# Patient Record
Sex: Female | Born: 1985 | Race: Black or African American | Hispanic: No | Marital: Single | Smoking: Former smoker
Health system: Southern US, Community
[De-identification: ages and names within clinical notes are randomized; demographics above are authoritative.]

## PROBLEM LIST (undated history)

## (undated) DIAGNOSIS — R87613 High grade squamous intraepithelial lesion on cytologic smear of cervix (HGSIL): Secondary | ICD-10-CM

## (undated) DIAGNOSIS — D649 Anemia, unspecified: Secondary | ICD-10-CM

## (undated) HISTORY — DX: Anemia, unspecified: D64.9

## (undated) HISTORY — DX: High grade squamous intraepithelial lesion on cytologic smear of cervix (HGSIL): R87.613

---

## 2004-12-27 ENCOUNTER — Emergency Department: Payer: Self-pay | Admitting: Emergency Medicine

## 2004-12-28 ENCOUNTER — Ambulatory Visit: Payer: Self-pay | Admitting: Emergency Medicine

## 2005-01-04 ENCOUNTER — Ambulatory Visit: Payer: Self-pay | Admitting: Obstetrics and Gynecology

## 2005-10-31 ENCOUNTER — Observation Stay: Payer: Self-pay | Admitting: Unknown Physician Specialty

## 2005-11-15 ENCOUNTER — Observation Stay: Payer: Self-pay

## 2005-11-16 ENCOUNTER — Inpatient Hospital Stay: Payer: Self-pay

## 2006-03-19 IMAGING — US US OB < 14 WEEKS - US OB TV
1 series · 14 of 28 positions shown · non-contrast
Comparison: none

REASON FOR EXAM: Pregnant with vaginal bleeding
COMMENTS:

[Series 1: us ob < 14 weeks - us ob tv · 0.38mm/px · 14 of 52 slices shown]
[im 2/52]
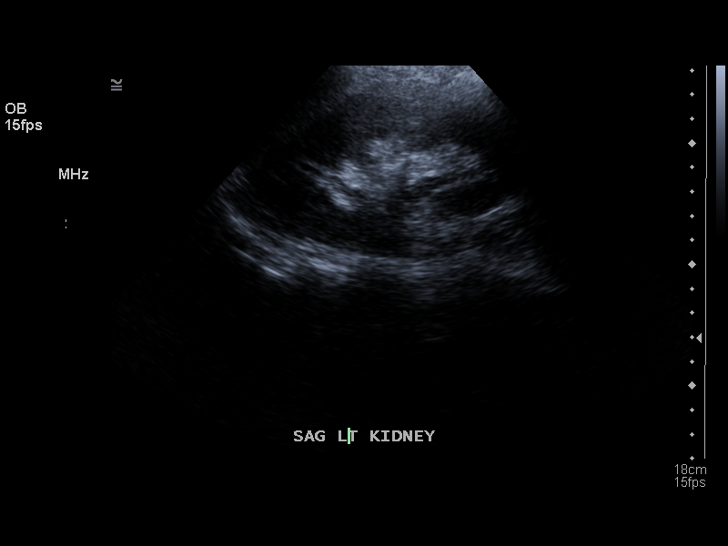
[im 6/52]
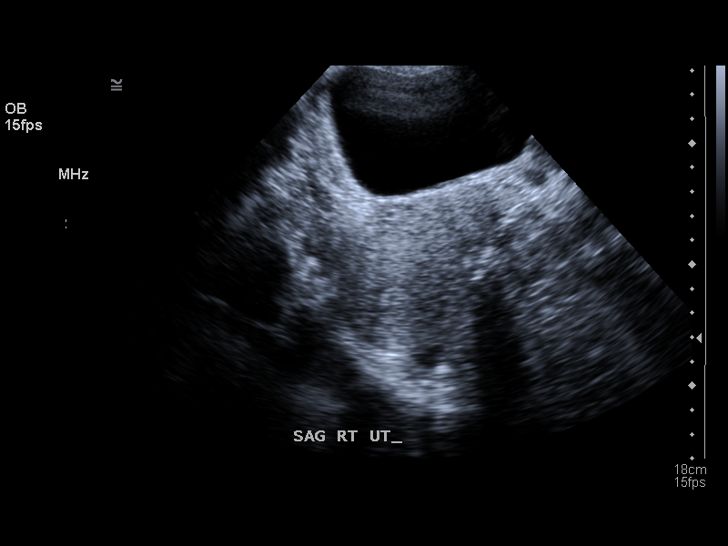
[im 10/52]
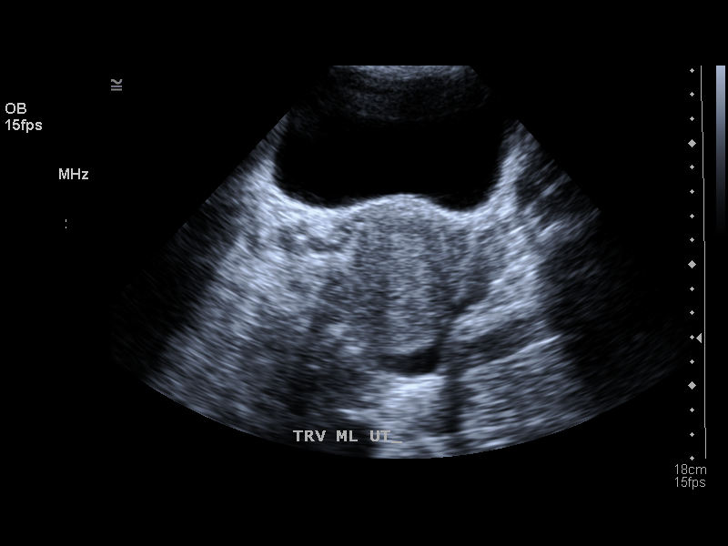
[im 14/52]
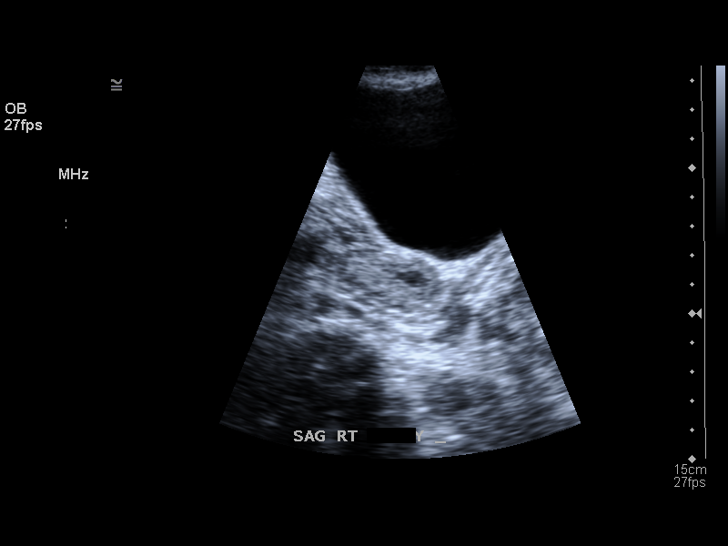
[im 18/52]
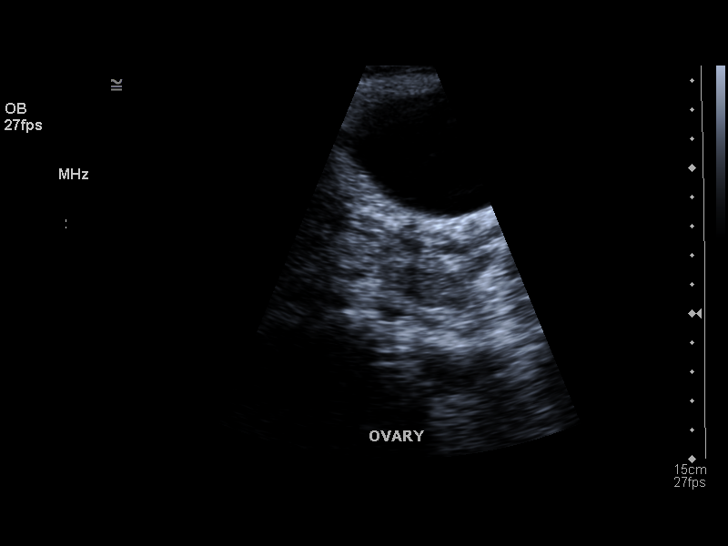
[im 21/52]
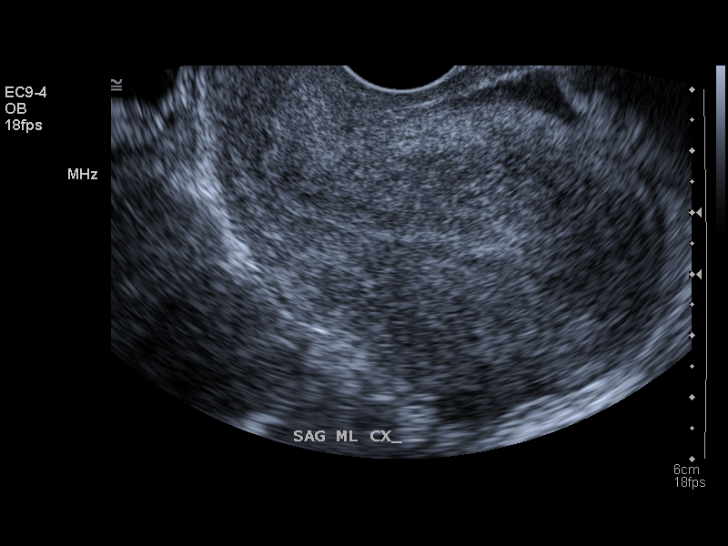
[im 25/52]
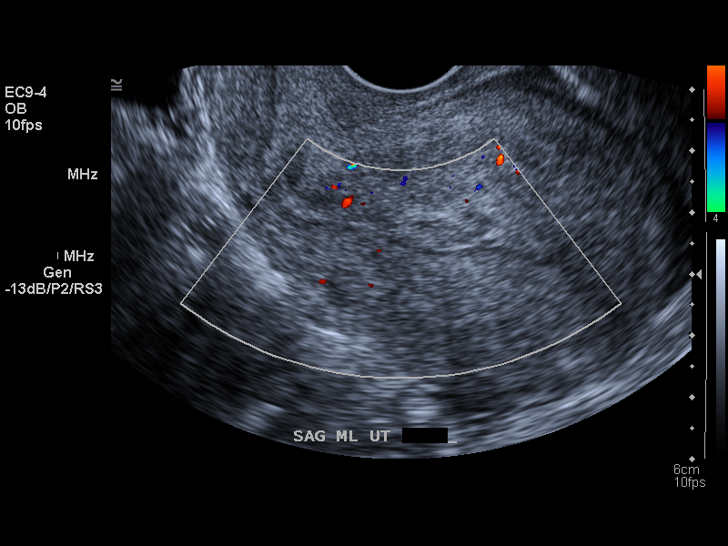
[im 29/52]
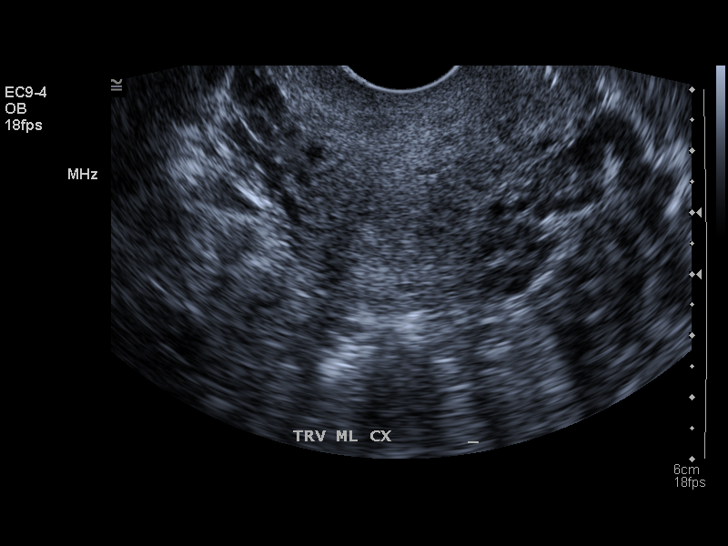
[im 33/52]
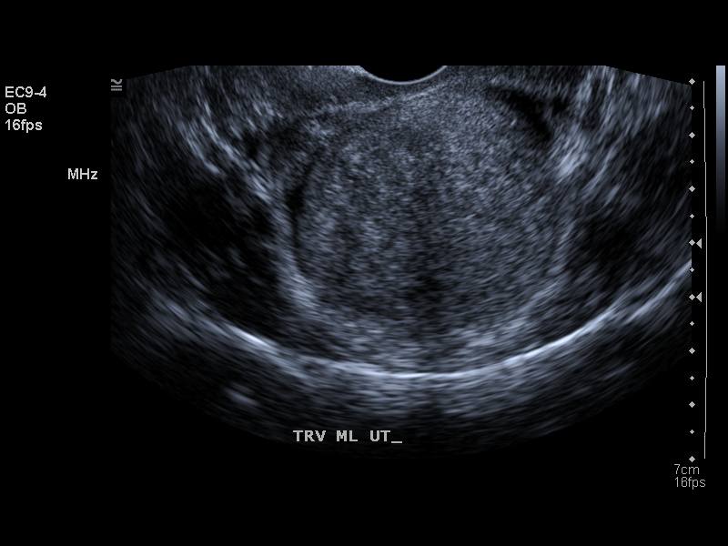
[im 36/52]
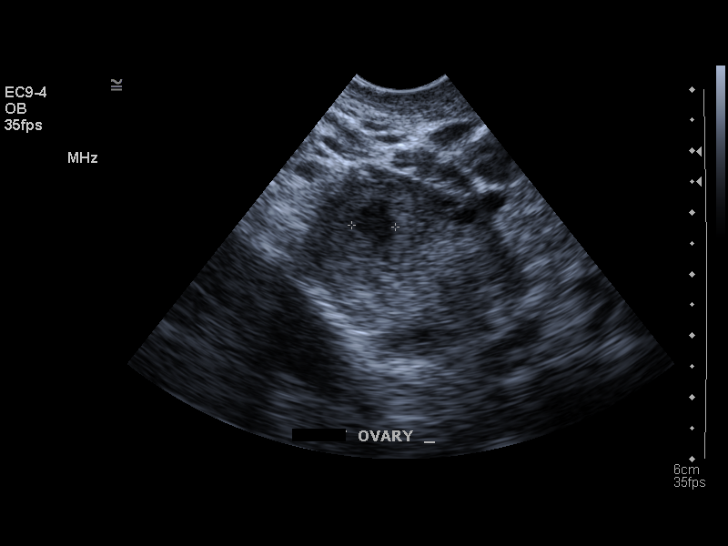
[im 40/52]
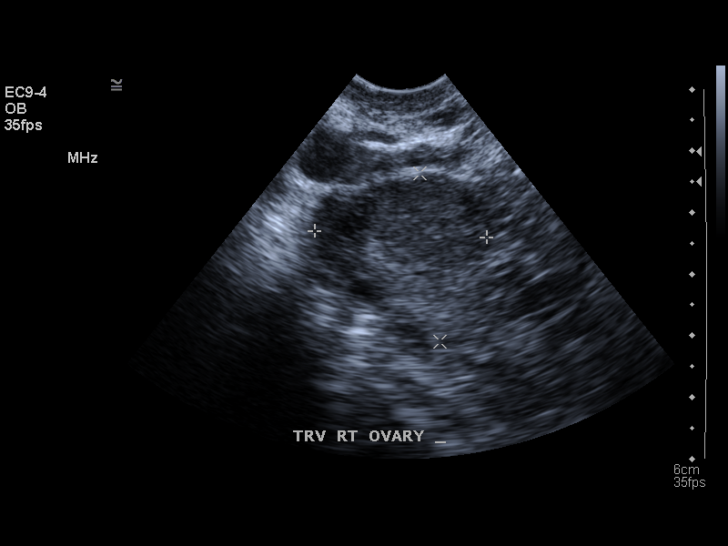
[im 44/52]
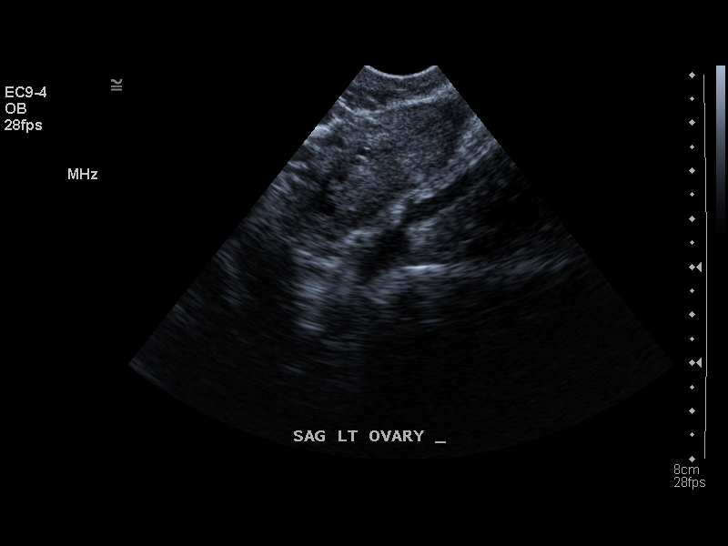
[im 48/52]
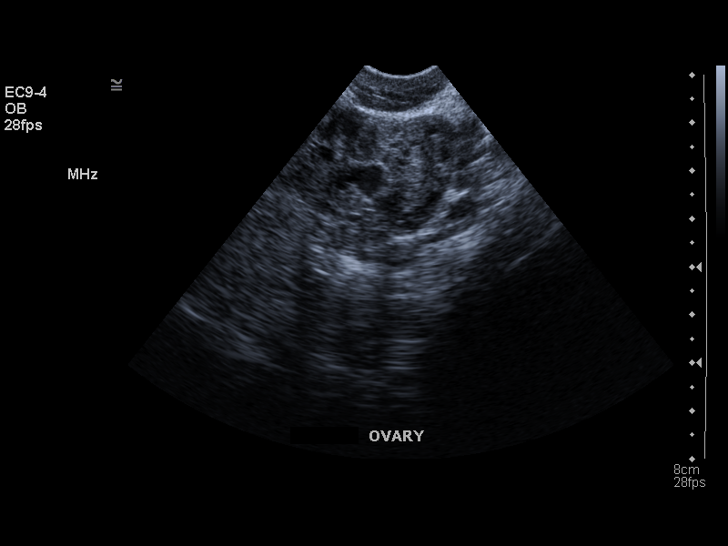
[im 52/52]
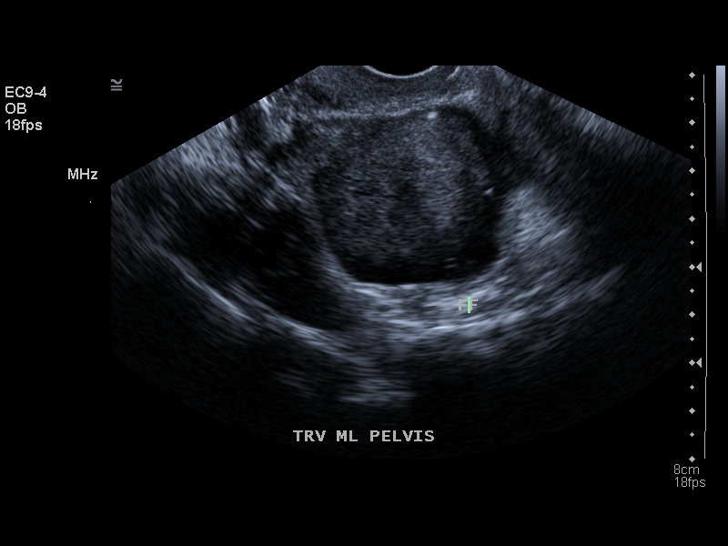

[14 of 28 positions shown; findings below may reference images not displayed]

PROCEDURE:     US  - US OB LESS THAN 14 WEEKS  - December 28, 2004 [DATE]

RESULT:       The patient has a positive pregnancy test.  The uterus
measures 10.4 cm x 7.3 cm x 6.4 cm.   The endometrium measures 1.66 cm in
thickness.  No intrauterine embryo is seen.  There does appear to be
echogenic material present in the endometrial cavity which could represent
clot or residual products of conception.  The findings observed are most
compatible with incomplete abortion.  Ectopic pregnancy is still a
consideration in the differential but is considered unlikely.   There is
noted a septated cyst of the RIGHT ovary which measures approximately
cm at maximum diameter.  No abnormal adnexal masses are seen.   No free
fluid is observed in the pelvis.
IMPRESSION: 1.     No living intrauterine gestation is identified.
2.     There is echogenic material in the endometrial cavity suspicious for
retained products of conception.
3.     No abnormal adnexal masses are seen.
4.     There is a septated cyst in the RIGHT ovary.
5.     Continued follow up or follow up HCG determinations are recommended.

## 2006-07-15 ENCOUNTER — Emergency Department: Payer: Self-pay | Admitting: General Practice

## 2006-08-16 ENCOUNTER — Emergency Department: Payer: Self-pay | Admitting: Emergency Medicine

## 2006-08-20 ENCOUNTER — Emergency Department: Payer: Self-pay

## 2012-01-23 HISTORY — PX: INTRAUTERINE DEVICE (IUD) INSERTION: SHX5877

## 2015-06-02 ENCOUNTER — Emergency Department
Admission: EM | Admit: 2015-06-02 | Discharge: 2015-06-02 | Disposition: A | Discharge status: Home / Self Care | Payer: Medicaid Other | Attending: Emergency Medicine | Admitting: Emergency Medicine

## 2015-06-02 ENCOUNTER — Encounter: Payer: Self-pay | Admitting: Emergency Medicine

## 2015-06-02 DIAGNOSIS — H109 Unspecified conjunctivitis: Secondary | ICD-10-CM

## 2015-06-02 DIAGNOSIS — H1031 Unspecified acute conjunctivitis, right eye: Secondary | ICD-10-CM | POA: Insufficient documentation

## 2015-06-02 DIAGNOSIS — H1030 Unspecified acute conjunctivitis, unspecified eye: Secondary | ICD-10-CM

## 2015-06-02 DIAGNOSIS — F17210 Nicotine dependence, cigarettes, uncomplicated: Secondary | ICD-10-CM | POA: Insufficient documentation

## 2015-06-02 MED ORDER — KETOROLAC TROMETHAMINE 0.5 % OP SOLN: 1.0000 [drp] | Freq: Four times a day (QID) | OPHTHALMIC | Status: DC

## 2015-06-02 MED ORDER — TETRACAINE HCL 0.5 % OP SOLN
OPHTHALMIC | Status: AC
  Filled 2015-06-02: qty 2

## 2015-06-02 MED ORDER — FLUORESCEIN SODIUM 1 MG OP STRP
ORAL_STRIP | OPHTHALMIC | Status: AC
  Filled 2015-06-02: qty 1

## 2015-06-02 MED ORDER — EYE WASH OPHTH SOLN
OPHTHALMIC | Status: AC
  Filled 2015-06-02: qty 118

## 2015-06-02 MED ORDER — POLYTRIM 10000-0.1 UNIT/ML-% OP SOLN: 2.0000 [drp] | OPHTHALMIC | Status: DC

## 2015-06-02 NOTE — Discharge Instructions (Signed)
You appear to have viral conjunctivitis. You should use the Acular drops for redness and irritation. Use OTC artificial tears as needed. Follow-up with your provider as needed.

## 2015-06-02 NOTE — ED Provider Notes (Signed)
[facility] Emergency Department Provider Note ____________________________________________  Time seen: 1041  I have reviewed the triage vital signs and the nursing notes.  HISTORY  Chief Complaint  Conjunctivitis  HPI Tammy Russo is a 30 y.o. female presents to the ED with irritation to the right eye for the last day. The patient describes some discomfort to the right eye that she noted yesterday. Since that time she's had increasing irritation, light sensitivity, and swelling to the eye. She is also noted copious tearing from the left eye. She denies any outright foreign body sensation or vision change.She rates her discomfort at a 5/10 in triage.  History reviewed. No pertinent past medical history.  There are no active problems to display for this patient.   History reviewed. No pertinent past surgical history.  Current Outpatient Rx  Name  Route  Sig  Dispense  Refill  . ketorolac (ACULAR) 0.5 % ophthalmic solution   Right Eye   Place 1 drop into the right eye 4 (four) times daily.   5 mL   0   . POLYTRIM ophthalmic solution   Right Eye   Place 2 drops into the right eye every 4 (four) hours.   10 mL   0     Dispense as written.     Allergies Review of patient's allergies indicates no known allergies.  No family history on file.  Social History Social History  Substance Use Topics  . Smoking status: Current Every Day Smoker -- 0.50 packs/day    Types: Cigarettes  . Smokeless tobacco: None  . Alcohol Use: Yes   Review of Systems  Constitutional: Negative for fever. Eyes: Negative for visual changes. Right eye irritation as above ENT: Negative for sore throat. Skin: Negative for rash. Neurological: Negative for headaches, focal weakness or numbness. ____________________________________________  PHYSICAL EXAM:  VITAL SIGNS: ED Triage Vitals  Enc Vitals Group     BP 06/02/15 0948 125/58 mmHg     Pulse Rate 06/02/15 0948  88     Resp 06/02/15 0948 18     Temp 06/02/15 0948 98.7 F (37.1 C)     Temp Source 06/02/15 0948 Oral     SpO2 06/02/15 0948 98 %     Weight 06/02/15 0948 180 lb (81.647 kg)     Height 06/02/15 0948 5\' 5"  (1.651 m)     Head Cir --      Peak Flow --      Pain Score 06/02/15 0948 5     Pain Loc --      Pain Edu? --      Excl. in [place]? --    Constitutional: Alert and oriented. Well appearing and in no distress. Head: Normocephalic and atraumatic.      Eyes: Conjunctivae are normal. PERRL. Normal extraocular movements. Right eye mildly injected with copious tearing. No gross foreign body noted. Normal upper lid on eversion. No fluorescein dye uptake.       Ears: Canals clear. TMs intact bilaterally.   Nose: No congestion/rhinorrhea.   Mouth/Throat: Mucous membranes are moist.   Neck: Supple. No thyromegaly. Hematological/Lymphatic/Immunological: No cervical or preauricilar lymphadenopathy. Respiratory: Normal respiratory effort.  Musculoskeletal: Nontender with normal range of motion in all extremities.  Neurologic:  Normal gait without ataxia. Normal speech and language. No gross focal neurologic deficits are appreciated. Skin:  Skin is warm, dry and intact. No rash noted. ____________________________________________  PROCEDURES  Tetracaine iii gtts right eye ____________________________________________  INITIAL IMPRESSION / ASSESSMENT  AND PLAN / ED COURSE  Patient with an acute gentamicin right eye likely consistent with a viral conjunctivitis. She'll be discharged with sugars for Acular as well as Polytrim for any potentially infectious etiology. She is advised to follow-up with her primary care provider or local [facility] clinic as needed. ____________________________________________  FINAL CLINICAL IMPRESSION(S) / ED DIAGNOSES  Final diagnoses:  Conjunctivitis, acute  Conjunctivitis of right eye      Melvenia Needles, PA-C 06/02/15 [place],  MD 06/04/15 [phone]

## 2015-06-02 NOTE — ED Notes (Signed)
Pt presents with right eye irritation and drainage that began yesterday. Pt states she thought she had a stye so she put stye cream in her eye.

## 2015-06-02 NOTE — ED Notes (Signed)
Stats she felt some pain to right eye yesterday  Unable to open eye this am    Swelling and drainage noted

## 2018-12-15 ENCOUNTER — Other Ambulatory Visit: Payer: Self-pay

## 2018-12-15 DIAGNOSIS — Z20822 Contact with and (suspected) exposure to covid-19: Secondary | ICD-10-CM

## 2018-12-16 LAB — NOVEL CORONAVIRUS, NAA: SARS-CoV-2, NAA: NOT DETECTED

## 2019-03-11 ENCOUNTER — Telehealth: Payer: Self-pay

## 2019-03-11 NOTE — Telephone Encounter (Signed)
Confirmed appointment on 03/16/2019 and screened for covid. klh  

## 2019-03-16 ENCOUNTER — Encounter: Payer: Self-pay | Admitting: Adult Health

## 2019-03-16 ENCOUNTER — Ambulatory Visit: Payer: Medicaid Other | Admitting: Adult Health

## 2019-03-16 ENCOUNTER — Other Ambulatory Visit: Payer: Self-pay

## 2019-03-16 VITALS — BP 122/76 | HR 80 | Temp 97.5°F | Resp 16 | Ht 64.5 in | Wt 188.2 lb

## 2019-03-16 DIAGNOSIS — Z7689 Persons encountering health services in other specified circumstances: Secondary | ICD-10-CM | POA: Diagnosis not present

## 2019-03-16 DIAGNOSIS — Z6831 Body mass index (BMI) 31.0-31.9, adult: Secondary | ICD-10-CM | POA: Diagnosis not present

## 2019-03-16 DIAGNOSIS — F17200 Nicotine dependence, unspecified, uncomplicated: Secondary | ICD-10-CM

## 2019-03-16 NOTE — Progress Notes (Signed)
[facility] [place], [place] [address]  Internal MEDICINE  Office Visit Note  Patient Name: Tammy Russo  [id number]  [id number]  Date of Service: 03/16/2019   Complaints/HPI Pt is here for establishment of PCP. Chief Complaint  Patient presents with  . New Patient (Initial Visit)   HPI Pt is seen today to establish care.  She is a well appearing 34 yo AA female.  She is currently single, living with her 3 kids who are 20,5, and 56 years old.  She reports she smokes half a pack of cigarettes daily.  She drinks alcohol socially, and reports she smokes marijuana occasionally.  She is up to date on her Pap.  Denies any real complaints at this time.  She is here to establish care to have a provider for the future.       Current Medication: Outpatient Encounter Medications as of 03/16/2019  Medication Sig  . [DISCONTINUED] ketorolac (ACULAR) 0.5 % ophthalmic solution Place 1 drop into the right eye 4 (four) times daily.  . [DISCONTINUED] POLYTRIM ophthalmic solution Place 2 drops into the right eye every 4 (four) hours.   No facility-administered encounter medications on file as of 03/16/2019.    Surgical History: No past surgical history on file.  Medical History: No past medical history on file.  Family History: No family history on file.  Social History   Socioeconomic History  . Marital status: Single    Spouse name: Not on file  . Number of children: Not on file  . Years of education: Not on file  . Highest education level: Not on file  Occupational History  . Not on file  Tobacco Use  . Smoking status: Current Every Day Smoker    Packs/day: 0.50    Types: Cigarettes  . Smokeless tobacco: Never Used  Substance and Sexual Activity  . Alcohol use: Yes    Comment: rare  . Drug use: Yes    Types: Marijuana  . Sexual activity: Not on file  Other Topics Concern  . Not on file  Social History Narrative  . Not on file   Social  Determinants of Health   Financial Resource Strain:   . Difficulty of Paying Living Expenses: Not on file  Food Insecurity:   . Worried About Charity fundraiser in the Last Year: Not on file  . Ran Out of Food in the Last Year: Not on file  Transportation Needs:   . Lack of Transportation (Medical): Not on file  . Lack of Transportation (Non-Medical): Not on file  Physical Activity:   . Days of Exercise per Week: Not on file  . Minutes of Exercise per Session: Not on file  Stress:   . Feeling of Stress : Not on file  Social Connections:   . Frequency of Communication with Friends and Family: Not on file  . Frequency of Social Gatherings with Friends and Family: Not on file  . Attends Religious Services: Not on file  . Active Member of Clubs or Organizations: Not on file  . Attends Archivist Meetings: Not on file  . Marital Status: Not on file  Intimate Partner Violence:   . Fear of Current or Ex-Partner: Not on file  . Emotionally Abused: Not on file  . Physically Abused: Not on file  . Sexually Abused: Not on file     Review of Systems  Constitutional: Negative for chills, fatigue and unexpected weight change.  HENT: Negative  for congestion, rhinorrhea, sneezing and sore throat.   Eyes: Negative for photophobia, pain and redness.  Respiratory: Negative for cough, chest tightness and shortness of breath.   Cardiovascular: Negative for chest pain and palpitations.  Gastrointestinal: Negative for abdominal pain, constipation, diarrhea, nausea and vomiting.  Endocrine: Negative.   Genitourinary: Negative for dysuria and frequency.  Musculoskeletal: Negative for arthralgias, back pain, joint swelling and neck pain.  Skin: Negative for rash.  Allergic/Immunologic: Negative.   Neurological: Negative for tremors and numbness.  Hematological: Negative for adenopathy. Does not bruise/bleed easily.  Psychiatric/Behavioral: Negative for behavioral problems and sleep  disturbance. The patient is not nervous/anxious.     Vital Signs: BP 122/76   Pulse 80   Temp (!) 97.5 F (36.4 C)   Resp 16   Ht 5' 4.5" (1.638 m)   Wt 188 lb 3.2 oz (85.4 kg)   SpO2 97%   BMI 31.81 kg/m    Physical Exam Vitals and nursing note reviewed.  Constitutional:      General: She is not in acute distress.    Appearance: She is well-developed. She is not diaphoretic.  HENT:     Head: Normocephalic and atraumatic.     Mouth/Throat:     Pharynx: No oropharyngeal exudate.  Eyes:     Pupils: Pupils are equal, round, and reactive to light.  Neck:     Thyroid: No thyromegaly.     Vascular: No JVD.     Trachea: No tracheal deviation.  Cardiovascular:     Rate and Rhythm: Normal rate and regular rhythm.     Heart sounds: Normal heart sounds. No murmur. No friction rub. No gallop.   Pulmonary:     Effort: Pulmonary effort is normal. No respiratory distress.     Breath sounds: Normal breath sounds. No wheezing or rales.  Chest:     Chest wall: No tenderness.  Abdominal:     Palpations: Abdomen is soft.     Tenderness: There is no abdominal tenderness. There is no guarding.  Musculoskeletal:        General: Normal range of motion.     Cervical back: Normal range of motion and neck supple.  Lymphadenopathy:     Cervical: No cervical adenopathy.  Skin:    General: Skin is warm and dry.  Neurological:     Mental Status: She is alert and oriented to person, place, and time.     Cranial Nerves: No cranial nerve deficit.  Psychiatric:        Behavior: Behavior normal.        Thought Content: Thought content normal.        Judgment: Judgment normal.    Assessment/Plan: 1. Encounter to establish care with new doctor Will get labs drawn. - CBC with Differential/Platelet - Lipid Panel With LDL/HDL Ratio - TSH - T4, free - Comprehensive metabolic panel  2. Adult BMI 31.0-31.9 kg/sq m Obesity Counseling: Risk Assessment: An assessment of behavioral risk factors  was made today and includes lack of exercise sedentary lifestyle, lack of portion control and poor dietary habits.  Risk Modification Advice: She was counseled on portion control guidelines. Restricting daily caloric intake to 1700. The detrimental long term effects of obesity on her health and ongoing poor compliance was also discussed with the patient.  3. Tobacco dependence Smoking cessation counseling: 1. Pt acknowledges the risks of long term smoking, she will try to quite smoking. 2. Options for different medications including nicotine products, chewing gum, patch etc,  Wellbutrin and Chantix is discussed 3. Goal and date of compete cessation is discussed 4. Total time spent in smoking cessation is 15 min.   General Counseling: Tammy Russo verbalizes understanding of the findings of todays visit and agrees with plan of treatment. I have discussed any further diagnostic evaluation that may be needed or ordered today. We also reviewed her medications today. she has been encouraged to call the office with any questions or concerns that should arise related to todays visit.  Orders Placed This Encounter  Procedures  . CBC with Differential/Platelet  . Lipid Panel With LDL/HDL Ratio  . TSH  . T4, free  . Comprehensive metabolic panel    No orders of the defined types were placed in this encounter.   Time spent: 30 Minutes   This patient was seen by Orson Gear AGNP-C in Collaboration with Dr Lavera Guise as a part of collaborative care agreement  Kendell Bane AGNP-C Internal Medicine

## 2019-04-02 ENCOUNTER — Telehealth: Payer: Self-pay

## 2019-04-02 NOTE — Telephone Encounter (Signed)
Called lmom informing patient of appointment on 04/06/2019. klh

## 2019-04-06 ENCOUNTER — Ambulatory Visit: Payer: Medicaid Other | Admitting: Adult Health

## 2019-04-09 LAB — LIPID PANEL WITH LDL/HDL RATIO
Cholesterol, Total: 137 mg/dL (ref 100–199)
HDL: 36 mg/dL — ABNORMAL LOW (ref >39)
LDL Chol Calc (NIH): 81 mg/dL (ref 0–99)
LDL/HDL Ratio: 2.3 ratio (ref 0.0–3.2)
Triglycerides: 105 mg/dL (ref 0–149)
VLDL Cholesterol Cal: 20 mg/dL (ref 5–40)

## 2019-04-09 LAB — CBC WITH DIFFERENTIAL/PLATELET
Basophils Absolute: 0.1 10*3/uL (ref 0.0–0.2)
Basos: 1 %
EOS (ABSOLUTE): 0.2 10*3/uL (ref 0.0–0.4)
Eos: 2 %
Hematocrit: 33.8 % — ABNORMAL LOW (ref 34.0–46.6)
Hemoglobin: 10.7 g/dL — ABNORMAL LOW (ref 11.1–15.9)
Immature Grans (Abs): 0 10*3/uL (ref 0.0–0.1)
Immature Granulocytes: 0 %
Lymphocytes Absolute: 2.5 10*3/uL (ref 0.7–3.1)
Lymphs: 28 %
MCH: 25.4 pg — ABNORMAL LOW (ref 26.6–33.0)
MCHC: 31.7 g/dL (ref 31.5–35.7)
MCV: 80 fL (ref 79–97)
Monocytes Absolute: 0.7 10*3/uL (ref 0.1–0.9)
Monocytes: 8 %
Neutrophils Absolute: 5.6 10*3/uL (ref 1.4–7.0)
Neutrophils: 61 %
Platelets: 315 10*3/uL (ref 150–450)
RBC: 4.21 x10E6/uL (ref 3.77–5.28)
RDW: 16.7 % — ABNORMAL HIGH (ref 11.7–15.4)
WBC: 9 10*3/uL (ref 3.4–10.8)

## 2019-04-09 LAB — COMPREHENSIVE METABOLIC PANEL
ALT: 6 IU/L (ref 0–32)
AST: 10 IU/L (ref 0–40)
Albumin/Globulin Ratio: 1.7 (ref 1.2–2.2)
Albumin: 4.3 g/dL (ref 3.8–4.8)
Alkaline Phosphatase: 63 IU/L (ref 39–117)
BUN/Creatinine Ratio: 10 (ref 9–23)
BUN: 7 mg/dL (ref 6–20)
Bilirubin Total: 0.4 mg/dL (ref 0.0–1.2)
CO2: 21 mmol/L (ref 20–29)
Calcium: 9.4 mg/dL (ref 8.7–10.2)
Chloride: 105 mmol/L (ref 96–106)
Creatinine, Ser: 0.72 mg/dL (ref 0.57–1.00)
GFR calc Af Amer: 125 mL/min/{1.73_m2} (ref >59)
GFR calc non Af Amer: 109 mL/min/{1.73_m2} (ref >59)
Globulin, Total: 2.6 g/dL (ref 1.5–4.5)
Glucose: 79 mg/dL (ref 65–99)
Potassium: 4.3 mmol/L (ref 3.5–5.2)
Sodium: 141 mmol/L (ref 134–144)
Total Protein: 6.9 g/dL (ref 6.0–8.5)

## 2019-04-09 LAB — T4, FREE: Free T4: 1.07 ng/dL (ref 0.82–1.77)

## 2019-04-09 LAB — TSH: TSH: 1.26 u[IU]/mL (ref 0.450–4.500)

## 2019-04-13 ENCOUNTER — Telehealth: Payer: Self-pay

## 2019-04-13 NOTE — Telephone Encounter (Signed)
Confirmed appointment on 04/15/2019 and screened for covid. klh °

## 2019-04-15 ENCOUNTER — Ambulatory Visit: Payer: Medicaid Other | Admitting: Adult Health

## 2019-04-15 ENCOUNTER — Other Ambulatory Visit: Payer: Self-pay

## 2019-04-15 ENCOUNTER — Encounter: Payer: Self-pay | Admitting: Adult Health

## 2019-04-15 VITALS — BP 123/81 | HR 91 | Temp 97.8°F | Resp 16 | Ht 65.0 in | Wt 181.0 lb

## 2019-04-15 DIAGNOSIS — F17200 Nicotine dependence, unspecified, uncomplicated: Secondary | ICD-10-CM | POA: Diagnosis not present

## 2019-04-15 DIAGNOSIS — R710 Precipitous drop in hematocrit: Secondary | ICD-10-CM

## 2019-04-15 NOTE — Progress Notes (Signed)
[facility] [place], [place] [address]  Internal MEDICINE  Office Visit Note  Patient Name: Tammy Russo  [id number]  [id number]  Date of Service: 05/20/2019  Chief Complaint  Patient presents with  . Follow-up    HPI  Pt is here for follow up.  She had her yearly labs drawn and is here to review those.  Her hemoglobin and HCT are slightly decreased.  She does report her menstrual period had just completed when she had her labs drawn.  Will recheck before next visit.  She denies any fatigue, sob, or other symptoms.  Her other labs were reviewed with her.     Current Medication: No outpatient encounter medications on file as of 04/15/2019.   No facility-administered encounter medications on file as of 04/15/2019.    Surgical History: History reviewed. No pertinent surgical history.  Medical History: History reviewed. No pertinent past medical history.  Family History: History reviewed. No pertinent family history.  Social History   Socioeconomic History  . Marital status: Single    Spouse name: Not on file  . Number of children: Not on file  . Years of education: Not on file  . Highest education level: Not on file  Occupational History  . Not on file  Tobacco Use  . Smoking status: Current Every Day Smoker    Packs/day: 0.50    Types: Cigarettes  . Smokeless tobacco: Never Used  Substance and Sexual Activity  . Alcohol use: Yes    Comment: rare  . Drug use: Yes    Types: Marijuana  . Sexual activity: Not on file  Other Topics Concern  . Not on file  Social History Narrative  . Not on file   Social Determinants of Health   Financial Resource Strain:   . Difficulty of Paying Living Expenses:   Food Insecurity:   . Worried About Charity fundraiser in the Last Year:   . Arboriculturist in the Last Year:   Transportation Needs:   . Film/video editor (Medical):   Marland Kitchen Lack of Transportation (Non-Medical):   Physical  Activity:   . Days of Exercise per Week:   . Minutes of Exercise per Session:   Stress:   . Feeling of Stress :   Social Connections:   . Frequency of Communication with Friends and Family:   . Frequency of Social Gatherings with Friends and Family:   . Attends Religious Services:   . Active Member of Clubs or Organizations:   . Attends Archivist Meetings:   Marland Kitchen Marital Status:   Intimate Partner Violence:   . Fear of Current or Ex-Partner:   . Emotionally Abused:   Marland Kitchen Physically Abused:   . Sexually Abused:       Review of Systems  Constitutional: Negative for chills, fatigue and unexpected weight change.  HENT: Negative for congestion, rhinorrhea, sneezing and sore throat.   Eyes: Negative for photophobia, pain and redness.  Respiratory: Negative for cough, chest tightness and shortness of breath.   Cardiovascular: Negative for chest pain and palpitations.  Gastrointestinal: Negative for abdominal pain, constipation, diarrhea, nausea and vomiting.  Endocrine: Negative.   Genitourinary: Negative for dysuria and frequency.  Musculoskeletal: Negative for arthralgias, back pain, joint swelling and neck pain.  Skin: Negative for rash.  Allergic/Immunologic: Negative.   Neurological: Negative for tremors and numbness.  Hematological: Negative for adenopathy. Does not bruise/bleed easily.  Psychiatric/Behavioral: Negative for behavioral problems and sleep  disturbance. The patient is not nervous/anxious.     Vital Signs: BP 123/81   Pulse 91   Temp 97.8 F (36.6 C)   Resp 16   Ht 5\' 5"  (1.651 m)   Wt 181 lb (82.1 kg)   SpO2 100%   BMI 30.12 kg/m    Physical Exam Vitals and nursing note reviewed.  Constitutional:      General: She is not in acute distress.    Appearance: She is well-developed. She is not diaphoretic.  HENT:     Head: Normocephalic and atraumatic.     Mouth/Throat:     Pharynx: No oropharyngeal exudate.  Eyes:     Pupils: Pupils are equal,  round, and reactive to light.  Neck:     Thyroid: No thyromegaly.     Vascular: No JVD.     Trachea: No tracheal deviation.  Cardiovascular:     Rate and Rhythm: Normal rate and regular rhythm.     Heart sounds: Normal heart sounds. No murmur. No friction rub. No gallop.   Pulmonary:     Effort: Pulmonary effort is normal. No respiratory distress.     Breath sounds: Normal breath sounds. No wheezing or rales.  Chest:     Chest wall: No tenderness.  Abdominal:     Palpations: Abdomen is soft.     Tenderness: There is no abdominal tenderness. There is no guarding.  Musculoskeletal:        General: Normal range of motion.     Cervical back: Normal range of motion and neck supple.  Lymphadenopathy:     Cervical: No cervical adenopathy.  Skin:    General: Skin is warm and dry.  Neurological:     Mental Status: She is alert and oriented to person, place, and time.     Cranial Nerves: No cranial nerve deficit.  Psychiatric:        Behavior: Behavior normal.        Thought Content: Thought content normal.        Judgment: Judgment normal.    Assessment/Plan: 1. Hemoglobin decreased Repeat CBC before next visit, being mindful of menstrual period. Return sooner for unexplained bruising, SOB, or fatigue. - CBC w/Diff/Platelet  2. Tobacco dependence Smoking cessation counseling: 1. Pt acknowledges the risks of long term smoking, she will try to quite smoking. 2. Options for different medications including nicotine products, chewing gum, patch etc, Wellbutrin and Chantix is discussed 3. Goal and date of compete cessation is discussed 4. Total time spent in smoking cessation is 15 min.   General Counseling: Joani verbalizes understanding of the findings of todays visit and agrees with plan of treatment. I have discussed any further diagnostic evaluation that may be needed or ordered today. We also reviewed her medications today. she has been encouraged to call the office with any  questions or concerns that should arise related to todays visit.    Orders Placed This Encounter  Procedures  . CBC w/Diff/Platelet    No orders of the defined types were placed in this encounter.   Time spent: 30 Minutes   This patient was seen by Orson Gear AGNP-C in Collaboration with Dr Lavera Guise as a part of collaborative care agreement     Kendell Bane AGNP-C Internal medicine

## 2019-04-21 ENCOUNTER — Telehealth: Payer: Self-pay

## 2019-04-21 ENCOUNTER — Other Ambulatory Visit: Payer: Self-pay | Admitting: Internal Medicine

## 2019-04-21 DIAGNOSIS — D649 Anemia, unspecified: Secondary | ICD-10-CM

## 2019-04-21 NOTE — Progress Notes (Signed)
iron

## 2019-04-21 NOTE — Telephone Encounter (Signed)
-----   Message from Lavera Guise, MD sent at 04/21/2019  9:39 AM EDT ----- Please notify pt that she is anemic and needs more labs drawn, I sent an order, please release if needed

## 2019-04-21 NOTE — Telephone Encounter (Signed)
Called pt and informed her to get her labs done because her last labs showed hemoglobin decrease. This was discussed at her last visit and pt was informed to not get her labs done while on her cycle. Pt is currently on her cycle and was told to wait 2-3 weeks after her cycle and redraw labs. Told pt that we added b12 and iron levels to her recheck on her cbc. Pt understood and will be getting labs done in 2-3 weeks and we will follow up at her next visit.

## 2019-05-21 ENCOUNTER — Telehealth: Payer: Self-pay

## 2019-05-21 NOTE — Telephone Encounter (Signed)
Tried contacting patient to inform of appointment on 05/25/2019, no voicemail. klh

## 2019-05-25 ENCOUNTER — Ambulatory Visit: Payer: Medicaid Other | Admitting: Adult Health

## 2019-05-29 ENCOUNTER — Telehealth: Payer: Self-pay

## 2019-05-29 NOTE — Telephone Encounter (Signed)
BILLED MISSED APPOINTMENT FEE FOR 05/25/19

## 2019-06-05 LAB — B12 AND FOLATE PANEL
Folate: 4.4 ng/mL (ref >3.0)
Vitamin B-12: 421 pg/mL (ref 232–1245)

## 2019-06-05 LAB — IRON,TIBC AND FERRITIN PANEL
Ferritin: 7 ng/mL — ABNORMAL LOW (ref 15–150)
Iron Saturation: 5 % — CL (ref 15–55)
Iron: 20 ug/dL — ABNORMAL LOW (ref 27–159)
Total Iron Binding Capacity: 388 ug/dL (ref 250–450)
UIBC: 368 ug/dL (ref 131–425)

## 2019-06-08 ENCOUNTER — Telehealth: Payer: Self-pay

## 2019-06-08 NOTE — Telephone Encounter (Signed)
Confirmed appointment on 06/10/2019 and screened for covid. klh 

## 2019-06-09 ENCOUNTER — Ambulatory Visit: Payer: Medicaid Other | Admitting: Adult Health

## 2019-06-10 ENCOUNTER — Other Ambulatory Visit: Payer: Self-pay

## 2019-06-10 ENCOUNTER — Ambulatory Visit: Payer: Medicaid Other | Admitting: Adult Health

## 2019-06-10 ENCOUNTER — Encounter: Payer: Self-pay | Admitting: Adult Health

## 2019-06-10 VITALS — BP 127/74 | HR 82 | Temp 97.6°F | Resp 16 | Ht 65.0 in | Wt 185.0 lb

## 2019-06-10 DIAGNOSIS — N926 Irregular menstruation, unspecified: Secondary | ICD-10-CM | POA: Diagnosis not present

## 2019-06-10 DIAGNOSIS — D508 Other iron deficiency anemias: Secondary | ICD-10-CM

## 2019-06-10 DIAGNOSIS — M79672 Pain in left foot: Secondary | ICD-10-CM

## 2019-06-10 DIAGNOSIS — F17200 Nicotine dependence, unspecified, uncomplicated: Secondary | ICD-10-CM

## 2019-06-10 MED ORDER — HEMOCYTE PLUS 106-1 MG PO CAPS: 1.0000 | ORAL_CAPSULE | Freq: Every day | ORAL | 2 refills | Status: DC

## 2019-06-10 NOTE — Progress Notes (Signed)
[facility] [place], [place] [address]  Internal MEDICINE  Office Visit Note  Patient Name: Tammy Russo  [id number]  [id number]  Date of Service: 06/10/2019  Chief Complaint  Patient presents with  . Follow-up    HPI  Pt is here for follow up on labs.  Her b12 and Folate are normal.  Her Iron studies show an Iron of 20, TIBC 388, Ferritin of 7, and iron saturation of 5. She denies any new or worsening symptoms.         Current Medication: Outpatient Encounter Medications as of 06/10/2019  Medication Sig  . Fe Fum-FA-B Cmp-C-Zn-Mg-Mn-Cu (HEMOCYTE PLUS) 106-1 MG CAPS Take 1 capsule by mouth daily.   No facility-administered encounter medications on file as of 06/10/2019.    Surgical History: History reviewed. No pertinent surgical history.  Medical History: History reviewed. No pertinent past medical history.  Family History: History reviewed. No pertinent family history.  Social History   Socioeconomic History  . Marital status: Single    Spouse name: Not on file  . Number of children: Not on file  . Years of education: Not on file  . Highest education level: Not on file  Occupational History  . Not on file  Tobacco Use  . Smoking status: Current Every Day Smoker    Packs/day: 0.50    Types: Cigarettes  . Smokeless tobacco: Never Used  Substance and Sexual Activity  . Alcohol use: Yes    Comment: rare  . Drug use: Yes    Types: Marijuana  . Sexual activity: Not on file  Other Topics Concern  . Not on file  Social History Narrative  . Not on file   Social Determinants of Health   Financial Resource Strain:   . Difficulty of Paying Living Expenses:   Food Insecurity:   . Worried About Charity fundraiser in the Last Year:   . Arboriculturist in the Last Year:   Transportation Needs:   . Film/video editor (Medical):   Marland Kitchen Lack of Transportation (Non-Medical):   Physical Activity:   . Days of Exercise per Week:   .  Minutes of Exercise per Session:   Stress:   . Feeling of Stress :   Social Connections:   . Frequency of Communication with Friends and Family:   . Frequency of Social Gatherings with Friends and Family:   . Attends Religious Services:   . Active Member of Clubs or Organizations:   . Attends Archivist Meetings:   Marland Kitchen Marital Status:   Intimate Partner Violence:   . Fear of Current or Ex-Partner:   . Emotionally Abused:   Marland Kitchen Physically Abused:   . Sexually Abused:       Review of Systems  Constitutional: Negative for chills, fatigue and unexpected weight change.  HENT: Negative for congestion, rhinorrhea, sneezing and sore throat.   Eyes: Negative for photophobia, pain and redness.  Respiratory: Negative for cough, chest tightness and shortness of breath.   Cardiovascular: Negative for chest pain and palpitations.  Gastrointestinal: Negative for abdominal pain, constipation, diarrhea, nausea and vomiting.  Endocrine: Negative.   Genitourinary: Negative for dysuria and frequency.  Musculoskeletal: Negative for arthralgias, back pain, joint swelling and neck pain.  Skin: Negative for rash.  Allergic/Immunologic: Negative.   Neurological: Negative for tremors and numbness.  Hematological: Negative for adenopathy. Does not bruise/bleed easily.  Psychiatric/Behavioral: Negative for behavioral problems and sleep disturbance. The patient is not  nervous/anxious.     Vital Signs: BP 127/74   Pulse 82   Temp 97.6 F (36.4 C)   Resp 16   Ht 5\' 5"  (1.651 m)   Wt 185 lb (83.9 kg)   SpO2 99%   BMI 30.79 kg/m    Physical Exam Vitals and nursing note reviewed.  Constitutional:      General: She is not in acute distress.    Appearance: She is well-developed. She is not diaphoretic.  HENT:     Head: Normocephalic and atraumatic.     Mouth/Throat:     Pharynx: No oropharyngeal exudate.  Eyes:     Pupils: Pupils are equal, round, and reactive to light.  Neck:      Thyroid: No thyromegaly.     Vascular: No JVD.     Trachea: No tracheal deviation.  Cardiovascular:     Rate and Rhythm: Normal rate and regular rhythm.     Heart sounds: Normal heart sounds. No murmur. No friction rub. No gallop.   Pulmonary:     Effort: Pulmonary effort is normal. No respiratory distress.     Breath sounds: Normal breath sounds. No wheezing or rales.  Chest:     Chest wall: No tenderness.  Abdominal:     Palpations: Abdomen is soft.     Tenderness: There is no abdominal tenderness. There is no guarding.  Musculoskeletal:        General: Normal range of motion.     Cervical back: Normal range of motion and neck supple.  Lymphadenopathy:     Cervical: No cervical adenopathy.  Skin:    General: Skin is warm and dry.  Neurological:     Mental Status: She is alert and oriented to person, place, and time.     Cranial Nerves: No cranial nerve deficit.  Psychiatric:        Behavior: Behavior normal.        Thought Content: Thought content normal.        Judgment: Judgment normal.     Assessment/Plan: 1. Other iron deficiency anemia Take hemocyte plus and follow up in 2 months.   2. Abnormal menstrual cycle Abnormal menstrual bleeding, heavy frequent cycles.  Pt has anemia now.   - Ambulatory referral to Gynecology  3. Tobacco dependence Smoking cessation counseling: 1. Pt acknowledges the risks of long term smoking, she will try to quite smoking. 2. Options for different medications including nicotine products, chewing gum, patch etc, Wellbutrin and Chantix is discussed 3. Goal and date of compete cessation is discussed 4. Total time spent in smoking cessation is 15 min.   General Counseling: Tammy Russo verbalizes understanding of the findings of todays visit and agrees with plan of treatment. I have discussed any further diagnostic evaluation that may be needed or ordered today. We also reviewed her medications today. she has been encouraged to call the office  with any questions or concerns that should arise related to todays visit.    Orders Placed This Encounter  Procedures  . CBC w/Diff/Platelet  . Fe+TIBC+Fer  . Ambulatory referral to Gynecology    Meds ordered this encounter  Medications  . Fe Fum-FA-B Cmp-C-Zn-Mg-Mn-Cu (HEMOCYTE PLUS) 106-1 MG CAPS    Sig: Take 1 capsule by mouth daily.    Dispense:  30 capsule    Refill:  2    Time spent: 30 Minutes   This patient was seen by Orson Gear AGNP-C in Collaboration with Dr Lavera Guise as a part of  collaborative care agreement     Kendell Bane AGNP-C Internal medicine

## 2019-06-18 ENCOUNTER — Other Ambulatory Visit (HOSPITAL_COMMUNITY)
Admission: RE | Admit: 2019-06-18 | Discharge: 2019-06-18 | Disposition: A | Discharge status: Home / Self Care | Payer: Medicaid Other | Source: Ambulatory Visit | Attending: Obstetrics and Gynecology | Admitting: Obstetrics and Gynecology

## 2019-06-18 ENCOUNTER — Other Ambulatory Visit: Payer: Self-pay

## 2019-06-18 ENCOUNTER — Ambulatory Visit (INDEPENDENT_AMBULATORY_CARE_PROVIDER_SITE_OTHER): Payer: Medicaid Other | Admitting: Obstetrics and Gynecology

## 2019-06-18 ENCOUNTER — Encounter: Payer: Self-pay | Admitting: Obstetrics and Gynecology

## 2019-06-18 VITALS — BP 147/97 | HR 99 | Wt 185.0 lb

## 2019-06-18 DIAGNOSIS — N939 Abnormal uterine and vaginal bleeding, unspecified: Secondary | ICD-10-CM

## 2019-06-18 DIAGNOSIS — Z113 Encounter for screening for infections with a predominantly sexual mode of transmission: Secondary | ICD-10-CM

## 2019-06-18 DIAGNOSIS — Z124 Encounter for screening for malignant neoplasm of cervix: Secondary | ICD-10-CM

## 2019-06-18 NOTE — Progress Notes (Signed)
Gynecology Abnormal Uterine Bleeding Initial Evaluation   Chief Complaint: No chief complaint on file.   History of Present Illness:    Paitient is a 34 y.o. No obstetric history on file. who LMP was No LMP recorded., presents today for a problem visit.  She complains of metrorrhagia (no pattern) that  began several months ago and its severity is described as moderate.  The patient menstrual complaints are chronic present for the past 6 months.  Prior to this had good cycle control on Mirena IUD placed 7 years ago.  Cycles are associated with moderate menstrual cramping.  The patient is sexually active. She currently uses IUDfor contraception.     Previous evaluation: CBC and TSH.  TSH normal, CBC with mild anemia  Review of Systems: Review of Systems  Constitutional: Negative.   Gastrointestinal: Negative.   Genitourinary: Negative.   Endo/Heme/Allergies: Does not bruise/bleed easily.    Past Medical History:  There are no problems to display for this patient.   Past Surgical History:  No past surgical history on file.  Obstetric History: No obstetric history on file.  Family History:  No family history on file.  Social History:  Social History   Socioeconomic History  . Marital status: Single    Spouse name: Not on file  . Number of children: Not on file  . Years of education: Not on file  . Highest education level: Not on file  Occupational History  . Not on file  Tobacco Use  . Smoking status: Current Every Day Smoker    Packs/day: 0.50    Types: Cigarettes  . Smokeless tobacco: Never Used  Substance and Sexual Activity  . Alcohol use: Yes    Comment: rare  . Drug use: Yes    Types: Marijuana  . Sexual activity: Not on file  Other Topics Concern  . Not on file  Social History Narrative  . Not on file   Social Determinants of Health   Financial Resource Strain:   . Difficulty of Paying Living Expenses:   Food Insecurity:   . Worried About Ship broker in the Last Year:   . Arboriculturist in the Last Year:   Transportation Needs:   . Film/video editor (Medical):   Marland Kitchen Lack of Transportation (Non-Medical):   Physical Activity:   . Days of Exercise per Week:   . Minutes of Exercise per Session:   Stress:   . Feeling of Stress :   Social Connections:   . Frequency of Communication with Friends and Family:   . Frequency of Social Gatherings with Friends and Family:   . Attends Religious Services:   . Active Member of Clubs or Organizations:   . Attends Archivist Meetings:   Marland Kitchen Marital Status:   Intimate Partner Violence:   . Fear of Current or Ex-Partner:   . Emotionally Abused:   Marland Kitchen Physically Abused:   . Sexually Abused:     Allergies:  No Known Allergies  Medications: Prior to Admission medications   Medication Sig Start Date End Date Taking? Authorizing Provider  Fe Fum-FA-B Cmp-C-Zn-Mg-Mn-Cu (HEMOCYTE PLUS) 106-1 MG CAPS Take 1 capsule by mouth daily. 06/10/19   Kendell Bane, NP    Physical Exam Blood pressure (!) 147/97, pulse 99, weight 185 lb (83.9 kg).  No LMP recorded.  General: NAD HEENT: normocephalic, anicteric Pulmonary: No increased work of breathing Genitourinary:  External: Normal external female genitalia.  Normal urethral meatus, normal  Bartholin's and Skene's glands.    Vagina: Normal vaginal mucosa, no evidence of prolapse.    Cervix: Grossly normal in appearance, no bleeding. Strings visualized flush with external cervical os 0.5cm  Uterus: Non-enlarged, mobile, slightly irregular contour.  No CMT  Adnexa: ovaries non-enlarged, no adnexal masses  Rectal: deferred  Lymphatic: no evidence of inguinal lymphadenopathy Extremities: no edema, erythema, or tenderness Neurologic: Grossly intact Psychiatric: mood appropriate, affect full  Female chaperone present for pelvic portions of the physical exam  Assessment: 34 y.o. No obstetric history on file. with abnormal uterine  bleeding  Plan: Problem List Items Addressed This Visit    None    Visit Diagnoses    Screening for malignant neoplasm of cervix    -  Primary   Relevant Orders   Cytology - PAP   Abnormal uterine bleeding       Relevant Orders   US Transvaginal Non-OB   Routine screening for STI (sexually transmitted infection)       Relevant Orders   Cytology - PAP      1) Discussed management options for abnormal uterine bleeding including expectant, NSAIDs, tranexamic acid (Lysteda), oral progesterone (Provera, norethindrone, megace), Depo Provera, Levonorgestrel containing IUD, endometrial ablation (Novasure) or hysterectomy as definitive surgical management.  Discussed risks and benefits of each method.   Final management decision will hinge on results of patient's work up and whether an underlying etiology for the patients bleeding symptoms can be discerned.  We will conduct a basic work up examining using the PALM-COIEN classification system.  In the meantime the patient opts to continue Mirena while we await results of her ultrasound and labs.  The role of unopposed estrogen in the development of endometrial hyperplasia or carcinoma is discussed.  The risk of endometrial hyperplasia is linearly correlated with increasing BMI given the production of estrone by adipose tissue. Printed patient education handouts were given to the patient to review at home.  Bleeding precautions reviewed.  - prior labs reviewed showing iron deficiency anemia, normal TSH - pap collected - TVUS ordered - IUD string visualized today 0.5cm, if IUD in proper position and no uterine structural lesions discussed potential of replacing IUD vs removal and BTL.  If evidence of fibroids or other uterine structural lesions discussed these may require surgical interventions  2) A total of 15 minutes were spent in face-to-face contact with the patient during this encounter with over half of that time devoted to counseling and  coordination of care.  3) Return in about 1 week (around 06/25/2019) for TVUS and follow.   [name], MD, [facility], [facility] 06/18/2019, 2:12 PM

## 2019-06-24 ENCOUNTER — Ambulatory Visit (INDEPENDENT_AMBULATORY_CARE_PROVIDER_SITE_OTHER): Payer: Medicaid Other

## 2019-06-24 ENCOUNTER — Other Ambulatory Visit: Payer: Self-pay

## 2019-06-24 ENCOUNTER — Ambulatory Visit (INDEPENDENT_AMBULATORY_CARE_PROVIDER_SITE_OTHER): Payer: Medicaid Other | Admitting: Obstetrics and Gynecology

## 2019-06-24 DIAGNOSIS — N939 Abnormal uterine and vaginal bleeding, unspecified: Secondary | ICD-10-CM

## 2019-06-24 DIAGNOSIS — Z30431 Encounter for routine checking of intrauterine contraceptive device: Secondary | ICD-10-CM

## 2019-06-24 DIAGNOSIS — D251 Intramural leiomyoma of uterus: Secondary | ICD-10-CM | POA: Diagnosis not present

## 2019-06-24 NOTE — Progress Notes (Signed)
I connected with Tammy Russo on 06/25/19 at  4:00 PM EDT by telephone and verified that I am speaking with the correct person using two identifiers.   I discussed the limitations, risks, security and privacy concerns of performing an evaluation and management service by telephone and the availability of in person appointments. I also discussed with the patient that there may be a patient responsible charge related to this service. The patient expressed understanding and agreed to proceed.  The patient was at home I spoke with the patient from my workstation phone The names of people involved in this encounter were: Tammy Russo , and [name]   Gynecology Ultrasound Follow Up  Chief Complaint: No chief complaint on file.    History of Present Illness: Patient is a 34 y.o. female who presents today for ultrasound evaluation of AUB and IUD location.  Ultrasound demonstrates the following findgins Adnexa: no masses seen  Uterus: Non-enlarged, two small intramural fibroid <2cm in size and not encroaching on the cavity with endometrial stripe thin Additional: IUD in proper location and deployment  Review of Systems: Review of Systems  Constitutional: Negative.   Gastrointestinal: Negative.   Genitourinary: Negative.     Past Medical History:  No past medical history on file.  Past Surgical History:  Past Surgical History:  Procedure Laterality Date  . INTRAUTERINE DEVICE (IUD) INSERTION  2014   Mirena    Gynecologic History:  Patient's last menstrual period was 06/12/2019. Contraception: diaphragm and IUD  Family History:  No family history on file.  Social History:  Social History   Socioeconomic History  . Marital status: Single    Spouse name: Not on file  . Number of children: Not on file  . Years of education: Not on file  . Highest education level: Not on file  Occupational History  . Not on file  Tobacco Use  . Smoking status: Current  Every Day Smoker    Packs/day: 0.50    Types: Cigarettes  . Smokeless tobacco: Never Used  Substance and Sexual Activity  . Alcohol use: Yes    Comment: rare  . Drug use: Yes    Types: Marijuana  . Sexual activity: Yes    Birth control/protection: I.U.D.  Other Topics Concern  . Not on file  Social History Narrative  . Not on file   Social Determinants of Health   Financial Resource Strain:   . Difficulty of Paying Living Expenses:   Food Insecurity:   . Worried About Charity fundraiser in the Last Year:   . Arboriculturist in the Last Year:   Transportation Needs:   . Film/video editor (Medical):   Marland Kitchen Lack of Transportation (Non-Medical):   Physical Activity:   . Days of Exercise per Week:   . Minutes of Exercise per Session:   Stress:   . Feeling of Stress :   Social Connections:   . Frequency of Communication with Friends and Family:   . Frequency of Social Gatherings with Friends and Family:   . Attends Religious Services:   . Active Member of Clubs or Organizations:   . Attends Archivist Meetings:   Marland Kitchen Marital Status:   Intimate Partner Violence:   . Fear of Current or Ex-Partner:   . Emotionally Abused:   Marland Kitchen Physically Abused:   . Sexually Abused:     Allergies:  No Known Allergies  Medications: Prior to Admission medications   Medication Sig  Start Date End Date Taking? Authorizing Provider  Fe Fum-FA-B Cmp-C-Zn-Mg-Mn-Cu (HEMOCYTE PLUS) 106-1 MG CAPS Take 1 capsule by mouth daily. 06/10/19   Kendell Bane, NP  levonorgestrel (MIRENA) 20 MCG/24HR IUD 1 each by Intrauterine route once.    [provider]    Physical Exam Vitals: Last menstrual period 06/12/2019.  General: NAD HEENT: normocephalic, anicteric Pulmonary: No increased work of breathing Extremities: no edema, erythema, or tenderness Neurologic: Grossly intact, normal gait Psychiatric: mood appropriate, affect full   Assessment: 34 y.o. [id number] [place] follow up  AUB Plan: Problem List Items Addressed This Visit    None      1) Ultrasound reviewed and  IUD in proper position, two small IM fibroids  Discussed mirena replacement, oriahnn, [place], and hysterectomy.  Will arrange for Mirena replacement  2) Telephone 9:41min  3) Return in about 2 weeks (around 07/08/2019) for 1-2 week Mirena IUD removal and replacement.   [name], MD, [facility], [place]

## 2019-06-29 LAB — CYTOLOGY - PAP
Chlamydia: NEGATIVE
Comment: NEGATIVE
Comment: NEGATIVE
Comment: NEGATIVE
Comment: NEGATIVE
Comment: NEGATIVE
Comment: NORMAL
HPV 16: NEGATIVE
HPV 18 / 45: POSITIVE — AB
High risk HPV: POSITIVE — AB
Neisseria Gonorrhea: NEGATIVE
Trichomonas: POSITIVE — AB

## 2019-07-02 ENCOUNTER — Other Ambulatory Visit: Payer: Self-pay | Admitting: Obstetrics and Gynecology

## 2019-07-02 ENCOUNTER — Telehealth: Payer: Self-pay | Admitting: Obstetrics and Gynecology

## 2019-07-02 MED ORDER — METRONIDAZOLE 500 MG PO TABS: 2000.0000 mg | ORAL_TABLET | Freq: Once | ORAL | 1 refills | Status: AC

## 2019-07-02 NOTE — Progress Notes (Signed)
Informed of pap results, will add on colposcopy to IUD reinsertion.  Also Rx flagyl for trichomonas.

## 2019-07-02 NOTE — Progress Notes (Signed)
Needs colposcopy added to her IUD removal and reinsertion on 06/09/2019

## 2019-07-02 NOTE — Telephone Encounter (Signed)
Colpo added to patient's schedule appointment per AMS

## 2019-07-02 NOTE — Telephone Encounter (Signed)
-----   Message from [name], MD sent at 07/02/2019  8:49 AM EDT ----- Needs colposcopy added to her IUD removal and reinsertion on 06/09/2019

## 2019-07-10 ENCOUNTER — Ambulatory Visit: Payer: Medicaid Other | Admitting: Obstetrics and Gynecology

## 2019-07-10 ENCOUNTER — Telehealth: Payer: Self-pay

## 2019-07-10 NOTE — Telephone Encounter (Signed)
Appointment moved to 7/12

## 2019-07-10 NOTE — Telephone Encounter (Signed)
Per AMS, ideally reschedule whole appointment prefers not to do Colpo while bleeding. Please call pt

## 2019-07-10 NOTE — Telephone Encounter (Signed)
Pt calling; has appt today for IUD and bx; has started period; keep appt?  [phone]  Adv pt to keep appt for IUD portion and resch colpo.

## 2019-07-10 NOTE — Telephone Encounter (Signed)
Noted  

## 2019-08-03 ENCOUNTER — Telehealth: Payer: Self-pay

## 2019-08-03 ENCOUNTER — Encounter: Payer: Self-pay | Admitting: Obstetrics and Gynecology

## 2019-08-03 ENCOUNTER — Other Ambulatory Visit: Payer: Self-pay

## 2019-08-03 ENCOUNTER — Other Ambulatory Visit (HOSPITAL_COMMUNITY)
Admission: RE | Admit: 2019-08-03 | Discharge: 2019-08-03 | Disposition: A | Discharge status: Home / Self Care | Payer: Medicaid Other | Source: Ambulatory Visit | Attending: Obstetrics and Gynecology | Admitting: Obstetrics and Gynecology

## 2019-08-03 ENCOUNTER — Ambulatory Visit (INDEPENDENT_AMBULATORY_CARE_PROVIDER_SITE_OTHER): Payer: Medicaid Other | Admitting: Obstetrics and Gynecology

## 2019-08-03 VITALS — BP 130/82 | Wt 186.0 lb

## 2019-08-03 DIAGNOSIS — B977 Papillomavirus as the cause of diseases classified elsewhere: Secondary | ICD-10-CM | POA: Diagnosis present

## 2019-08-03 DIAGNOSIS — R87612 Low grade squamous intraepithelial lesion on cytologic smear of cervix (LGSIL): Secondary | ICD-10-CM

## 2019-08-03 DIAGNOSIS — Z30430 Encounter for insertion of intrauterine contraceptive device: Secondary | ICD-10-CM

## 2019-08-03 DIAGNOSIS — N72 Inflammatory disease of cervix uteri: Secondary | ICD-10-CM | POA: Diagnosis present

## 2019-08-03 DIAGNOSIS — Z30432 Encounter for removal of intrauterine contraceptive device: Secondary | ICD-10-CM

## 2019-08-03 NOTE — Telephone Encounter (Signed)
Confirmed appointment on 08/05/2019 and screened for covid. klh 

## 2019-08-03 NOTE — Progress Notes (Signed)
   GYNECOLOGY CLINIC COLPOSCOPY PROCEDURE NOTE  34 y.o. 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 Discussed underlying role for HPV infection in the development of cervical dysplasia, its natural history and progression/regression, need for surveillance.  Is the patient  pregnant: No LMP: No LMP recorded. (Menstrual status: IUD). Smoking status:  reports that she has been smoking cigarettes. She has been smoking about 0.50 packs per day. She has never used smokeless tobacco. Contraception: IUD  Patient given informed consent, signed copy in the chart, time out was performed.  The patient was position in dorsal lithotomy position. Speculum was placed the cervix was visualized.   After application of acetic acid colposcopic inspection of the cervix was undertaken.   Colposcopy adequate, full visualization of transformation zone: Yes acetowhite lesion(s) noted at 5 o'clock; corresponding biopsies obtained.   ECC specimen obtained:  Yes  All specimens were labeled and sent to pathology.   Patient was given post procedure instructions.  Will follow up pathology and manage accordingly.  Routine preventative health maintenance measures emphasized.   [name], MD, [facility], [place] OFFICE PROCEDURE NOTE  Tammy Russo is a 34 y.o. [phone] here for IUD removal and reinsertion. The patient currently has a Mirena IUD placed > 7 years ago, which will be replaced with a Mirena IUD today.  No GYN concerns.   IUD Removal and Reinsertion  Patient identified, informed consent performed, consent signed.   Discussed risks of irregular bleeding, cramping, infection, malpositioning or uterine perforation of the IUD which may require further procedures. Time out was performed. Speculum placed in the vagina. The strings of the IUD  were grasped and pulled using ring forceps. The IUD was successfully removed in its entirety. The cervix was cleaned with Betadine x 2 and grasped anteriorly with a single tooth tenaculum.  The uterus was sounded to IUD insertion apparatus was used to sound the uterus to 8 cm using a uterine sound.  The IUD was then placed per manufacturer's recommendations. Strings trimmed to 3 cm. Tenaculum was removed, good hemostasis noted. Patient tolerated procedure well.   Patient was given post-procedure instructions.  Patient was also asked to check IUD strings periodically and follow up in 6 weeks for IUD check.   [name], MD, [facility], [place]

## 2019-08-05 ENCOUNTER — Ambulatory Visit: Payer: Medicaid Other | Admitting: Adult Health

## 2019-08-06 ENCOUNTER — Telehealth: Payer: Self-pay

## 2019-08-06 LAB — SURGICAL PATHOLOGY

## 2019-08-06 NOTE — Telephone Encounter (Signed)
I spoke to pt, reassured and answered her questions

## 2019-08-06 NOTE — Telephone Encounter (Signed)
Pt calling; had procedure done Monday; has questions.  [phone]

## 2019-08-12 ENCOUNTER — Encounter: Payer: Self-pay | Admitting: Obstetrics and Gynecology

## 2019-08-12 DIAGNOSIS — R87613 High grade squamous intraepithelial lesion on cytologic smear of cervix (HGSIL): Secondary | ICD-10-CM | POA: Insufficient documentation

## 2019-08-20 ENCOUNTER — Telehealth: Payer: Self-pay | Admitting: Obstetrics and Gynecology

## 2019-08-20 NOTE — Telephone Encounter (Signed)
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

## 2019-08-20 NOTE — Telephone Encounter (Signed)
Called pt to schedule cold knife conization with [facility]  DOS 8/12  H&P 8/5 @ 11:10 in [place] testing 8/10 @ 8-10:30, [address], drive up and wear mask. Advised pt to quarantine until DOS.  Pre-admit phone call appointment to be requested - date and time will be included on H&P paper work. Also all appointments will be updated on pt MyChart. Explained that this appointment has a call window. Based on the time scheduled will indicate if the call will be received within a 4 hour window before 1:00 or after.  Advised that pt may also receive calls from the hospital pharmacy and pre-service center.  Pt currently has [employer] but starting 8/1 will be [employer]

## 2019-08-27 ENCOUNTER — Other Ambulatory Visit: Payer: Self-pay

## 2019-08-27 ENCOUNTER — Encounter: Payer: Self-pay | Admitting: Obstetrics and Gynecology

## 2019-08-27 ENCOUNTER — Ambulatory Visit (INDEPENDENT_AMBULATORY_CARE_PROVIDER_SITE_OTHER): Payer: Medicaid Other | Admitting: Obstetrics and Gynecology

## 2019-08-27 VITALS — BP 141/94 | HR 76 | Ht 65.0 in | Wt 185.0 lb

## 2019-08-27 DIAGNOSIS — R87613 High grade squamous intraepithelial lesion on cytologic smear of cervix (HGSIL): Secondary | ICD-10-CM

## 2019-08-27 DIAGNOSIS — Z01818 Encounter for other preprocedural examination: Secondary | ICD-10-CM

## 2019-08-27 NOTE — H&P (View-Only) (Signed)
Obstetrics & Gynecology Surgery H&P    Chief Complaint: Scheduled Surgery   History of Present Illness: Patient is a 34 y.o. [id number] presenting for scheduled cold knife conization (CKC), for the treatment or further evaluation of cervical dysplasia.   Prior Treatments prior to proceeding with surgery include: colposcopy  Preoperative Pap: 06/18/2019 Results: LGSIL HPV positive (18/45 positive)  Colposcopy: 08/03/2019 negative ectocervical biopsy but high grade dysplasia noted on ECC specimen Preoperative Ultrasound: N/A   Review of Systems:10 point review of systems  Past Medical History:  Patient Active Problem List   Diagnosis Date Noted  . 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 INTRAUTERINE DEVICE (IUD) INSERTION  2014   Mirena    Family History:  History reviewed. No pertinent family history.  Social History:  Social History   Socioeconomic History  . Marital status: Single    Spouse name: Not on file  . Number of children: Not on file  . Years of education: Not on file  . Highest education level: Not on file  Occupational History  . Not on file  Tobacco Use  . Smoking status: Current Every Day Smoker    Packs/day: 0.50    Types: Cigarettes  . Smokeless tobacco: Never Used  Substance and Sexual Activity  . Alcohol use: Yes    Comment: rare  . Drug use: Yes    Types: Marijuana  . Sexual activity: Yes    Birth control/protection: I.U.D.  Other Topics Concern  . Not on file  Social History Narrative  . Not on file   Social Determinants of Health   Financial Resource Strain:   . Difficulty of Paying Living Expenses:   Food Insecurity:   . Worried About Charity fundraiser in the Last Year:   . Arboriculturist in the Last Year:   Transportation Needs:   . Film/video editor (Medical):   Marland Kitchen Lack of Transportation (Non-Medical):   Physical  Activity:   . Days of Exercise per Week:   . Minutes of Exercise per Session:   Stress:   . Feeling of Stress :   Social Connections:   . Frequency of Communication with Friends and Family:   . Frequency of Social Gatherings with Friends and Family:   . Attends Religious Services:   . Active Member of Clubs or Organizations:   . Attends Archivist Meetings:   Marland Kitchen Marital Status:   Intimate Partner Violence:   . Fear of Current or Ex-Partner:   . Emotionally Abused:   Marland Kitchen Physically Abused:   . Sexually Abused:     Allergies:  No Known Allergies  Medications: Prior to Admission medications   Medication Sig Start Date End Date Taking? Authorizing Provider  Fe Fum-FA-B Cmp-C-Zn-Mg-Mn-Cu (HEMOCYTE PLUS) 106-1 MG CAPS Take 1 capsule by mouth daily. 06/10/19  Yes Kendell Bane, NP  levonorgestrel (MIRENA) 20 MCG/24HR IUD 1 each by Intrauterine route once.   Yes [provider]    Physical Exam Vitals: Blood pressure (!) 141/94, pulse 76, height 5\' 5"  (1.651 m), weight 185 lb (83.9 kg).   HEENT: normocephalic, anicteric Pulmonary: No increased work of breathing, CTAB Cardiovascular: RRR, distal pulses 2+ Extremities: no edema, erythema, or tenderness Neurologic: Grossly intact Psychiatric: mood appropriate, affect full  Imaging No results found.  Assessment: 34 y.o. [id number] presenting for scheduled CKC  Plan: 1) I have had  a long and careful discussion with this patient about the pros and cons, risks/benefits of each option available.  She understands that cryotherapy is a minor procedure with approximately a 85% success rate.  She also understands that cold knife cone biopsy is outpatient surgery with a 95% success rate and approximately a 5% recurrence rate.  She understands that this is surgery and has all the risks of any surgery including but not limited to the risks of anesthesia, hemorrhage, infection, perforation, injury to bowel, bladder and blood  vessels.  She also understands the unlikely but potential effect on both getting pregnant and the ability of carrying a pregnancy.  Hysterectomy is the final option with a 99% success rate.  After careful discussion of all these options and her unique factors, mutual decision has been made to proceed with a cold knife cone biopsy   2) Routine postoperative instructions were reviewed with the patient and her family in detail today including the expected length of recovery and likely postoperative course.  The patient concurred with the proposed plan, giving informed written consent for the surgery today.  Patient instructed on the importance of being NPO after midnight prior to her procedure.  If warranted preoperative prophylactic antibiotics and SCDs ordered on call to the OR to meet SCIP guidelines and adhere to recommendation laid forth in [place] Number 104 May 2009  "Antibiotic Prophylaxis for Gynecologic Procedures".     [name], MD, [facility], [facility] 08/27/2019, 11:49 AM

## 2019-08-27 NOTE — Progress Notes (Signed)
Obstetrics & Gynecology Surgery H&P    Chief Complaint: Scheduled Surgery   History of Present Illness: Patient is a 34 y.o. [id number] presenting for scheduled cold knife conization (CKC), for the treatment or further evaluation of cervical dysplasia.   Prior Treatments prior to proceeding with surgery include: colposcopy  Preoperative Pap: 06/18/2019 Results: LGSIL HPV positive (18/45 positive)  Colposcopy: 08/03/2019 negative ectocervical biopsy but high grade dysplasia noted on ECC specimen Preoperative Ultrasound: N/A   Review of Systems:10 point review of systems  Past Medical History:  Patient Active Problem List   Diagnosis Date Noted  . 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 INTRAUTERINE DEVICE (IUD) INSERTION  2014   Mirena    Family History:  History reviewed. No pertinent family history.  Social History:  Social History   Socioeconomic History  . Marital status: Single    Spouse name: Not on file  . Number of children: Not on file  . Years of education: Not on file  . Highest education level: Not on file  Occupational History  . Not on file  Tobacco Use  . Smoking status: Current Every Day Smoker    Packs/day: 0.50    Types: Cigarettes  . Smokeless tobacco: Never Used  Substance and Sexual Activity  . Alcohol use: Yes    Comment: rare  . Drug use: Yes    Types: Marijuana  . Sexual activity: Yes    Birth control/protection: I.U.D.  Other Topics Concern  . Not on file  Social History Narrative  . Not on file   Social Determinants of Health   Financial Resource Strain:   . Difficulty of Paying Living Expenses:   Food Insecurity:   . Worried About Charity fundraiser in the Last Year:   . Arboriculturist in the Last Year:   Transportation Needs:   . Film/video editor (Medical):   Marland Kitchen Lack of Transportation (Non-Medical):   Physical  Activity:   . Days of Exercise per Week:   . Minutes of Exercise per Session:   Stress:   . Feeling of Stress :   Social Connections:   . Frequency of Communication with Friends and Family:   . Frequency of Social Gatherings with Friends and Family:   . Attends Religious Services:   . Active Member of Clubs or Organizations:   . Attends Archivist Meetings:   Marland Kitchen Marital Status:   Intimate Partner Violence:   . Fear of Current or Ex-Partner:   . Emotionally Abused:   Marland Kitchen Physically Abused:   . Sexually Abused:     Allergies:  No Known Allergies  Medications: Prior to Admission medications   Medication Sig Start Date End Date Taking? Authorizing Provider  Fe Fum-FA-B Cmp-C-Zn-Mg-Mn-Cu (HEMOCYTE PLUS) 106-1 MG CAPS Take 1 capsule by mouth daily. 06/10/19  Yes Kendell Bane, NP  levonorgestrel (MIRENA) 20 MCG/24HR IUD 1 each by Intrauterine route once.   Yes [provider]    Physical Exam Vitals: Blood pressure (!) 141/94, pulse 76, height 5\' 5"  (1.651 m), weight 185 lb (83.9 kg).   HEENT: normocephalic, anicteric Pulmonary: No increased work of breathing, CTAB Cardiovascular: RRR, distal pulses 2+ Extremities: no edema, erythema, or tenderness Neurologic: Grossly intact Psychiatric: mood appropriate, affect full  Imaging No results found.  Assessment: 34 y.o. [id number] presenting for scheduled CKC  Plan: 1) I have had  a long and careful discussion with this patient about the pros and cons, risks/benefits of each option available.  She understands that cryotherapy is a minor procedure with approximately a 85% success rate.  She also understands that cold knife cone biopsy is outpatient surgery with a 95% success rate and approximately a 5% recurrence rate.  She understands that this is surgery and has all the risks of any surgery including but not limited to the risks of anesthesia, hemorrhage, infection, perforation, injury to bowel, bladder and blood  vessels.  She also understands the unlikely but potential effect on both getting pregnant and the ability of carrying a pregnancy.  Hysterectomy is the final option with a 99% success rate.  After careful discussion of all these options and her unique factors, mutual decision has been made to proceed with a cold knife cone biopsy   2) Routine postoperative instructions were reviewed with the patient and her family in detail today including the expected length of recovery and likely postoperative course.  The patient concurred with the proposed plan, giving informed written consent for the surgery today.  Patient instructed on the importance of being NPO after midnight prior to her procedure.  If warranted preoperative prophylactic antibiotics and SCDs ordered on call to the OR to meet SCIP guidelines and adhere to recommendation laid forth in [place] Number 104 May 2009  "Antibiotic Prophylaxis for Gynecologic Procedures".     [name], MD, [facility], [facility] 08/27/2019, 11:49 AM

## 2019-08-28 ENCOUNTER — Other Ambulatory Visit
Admission: RE | Admit: 2019-08-28 | Discharge: 2019-08-28 | Disposition: A | Discharge status: Home / Self Care | Payer: Medicaid Other | Source: Ambulatory Visit | Attending: Obstetrics and Gynecology | Admitting: Obstetrics and Gynecology

## 2019-08-28 NOTE — Patient Instructions (Signed)
Your procedure is scheduled on: Thursday 09/03/19.  Report to DAY SURGERY DEPARTMENT LOCATED ON 2ND FLOOR MEDICAL MALL ENTRANCE. To find out your arrival time please call [phone] between 1PM - 3PM on Wednesday 09/02/19.   Remember: Instructions that are not followed completely may result in serious medical risk, up to and including death, or upon the discretion of your surgeon and anesthesiologist your surgery may need to be rescheduled.     __X__ 1. Do not eat food after midnight the night before your procedure.                 No gum chewing or hard candies. You may drink clear liquids up to 2 hours                 before you are scheduled to arrive for your surgery- DO NOT drink clear                 liquids within 2 hours of the start of your surgery.                 Clear Liquids include:  water, apple juice without pulp, clear carbohydrate                 drink such as Clearfast or Gatorade, Black Coffee or Tea (Do not add                 milk or creamer to coffee or tea).  ** Dr. Georgianne Fick would like for you to finish your Pre-Surgery Ensure drink 2 hours prior to your arrival time on the morning of surgery. **    __X__2.  On the morning of surgery brush your teeth with toothpaste and water, you may rinse your mouth with mouthwash if you wish.  Do not swallow any toothpaste or mouthwash.   __X__ 3.  No Alcohol for 24 hours before or after surgery.  __X__ 4.  Do Not Smoke or use e-cigarettes For 24 Hours Prior to Your Surgery.                 Do not use any chewable tobacco products for at least 6 hours prior to                 surgery.  __X__5.  Notify your doctor if there is any change in your medical condition      (cold, fever, infections).      Do NOT wear jewelry, make-up, hairpins, clips or nail polish. Do NOT wear lotions, powders, or perfumes.  Do NOT shave 48 hours prior to surgery. Men may shave face and neck. Do NOT bring valuables to the hospital.      [facility] is not responsible for any belongings or valuables.   Contacts, dentures/partials or body piercings may not be worn into surgery. Bring a case for your contacts, glasses or hearing aids, a denture cup will be supplied. Leave your suitcase in the car. After surgery it may be brought to your room.   For patients admitted to the hospital, discharge time is determined by your treatment team.    Patients discharged the day of surgery will not be allowed to drive home.     __X__ Take these medicines the morning of surgery with A SIP OF WATER:     1. None    __X__ Shower on the morning of surgery before coming to the hospital.  __X__ Stop Anti-inflammatories 7 days before surgery such  as Advil, Ibuprofen, Motrin, BC or Goodies Powder, Naprosyn, Naproxen, Aleve, Aspirin, Meloxicam. May take Tylenol if needed for pain or discomfort.   __X__Do not start taking any new herbal supplements or vitamins prior to your procedure.     Wear comfortable clothing (specific to your surgery type) to the hospital.  Plan for stool softeners for home use; pain medications have a tendency to cause constipation. You can also help prevent constipation by eating foods high in fiber such as fruits and vegetables and drinking plenty of fluids as your diet allows.  After surgery, you can prevent lung complications by doing breathing exercises.Take deep breaths and cough every 1-2 hours. Your doctor may order a device called an Incentive Spirometer to help you take deep breaths.  Please call the [facility] at [phone] if you have any questions about these instructions

## 2019-09-01 ENCOUNTER — Other Ambulatory Visit
Admission: RE | Admit: 2019-09-01 | Discharge: 2019-09-01 | Disposition: A | Discharge status: Home / Self Care | Payer: Medicaid Other | Source: Ambulatory Visit | Attending: Obstetrics and Gynecology | Admitting: Obstetrics and Gynecology

## 2019-09-01 DIAGNOSIS — Z01812 Encounter for preprocedural laboratory examination: Secondary | ICD-10-CM | POA: Diagnosis present

## 2019-09-01 DIAGNOSIS — Z20822 Contact with and (suspected) exposure to covid-19: Secondary | ICD-10-CM | POA: Diagnosis not present

## 2019-09-01 LAB — SARS CORONAVIRUS 2 (TAT 6-24 HRS): SARS Coronavirus 2: NEGATIVE

## 2019-09-03 ENCOUNTER — Encounter: Payer: Self-pay | Admitting: Obstetrics and Gynecology

## 2019-09-03 ENCOUNTER — Ambulatory Visit: Payer: Medicaid Other | Admitting: Certified Registered"

## 2019-09-03 ENCOUNTER — Other Ambulatory Visit: Payer: Self-pay

## 2019-09-03 ENCOUNTER — Encounter
Admission: RE | Disposition: A | Discharge status: Home / Self Care | Payer: Self-pay | Source: Home / Self Care | Attending: Obstetrics and Gynecology

## 2019-09-03 ENCOUNTER — Ambulatory Visit
Admission: RE | Admit: 2019-09-03 | Discharge: 2019-09-03 | Disposition: A | Discharge status: Home / Self Care | Payer: Medicaid Other | Attending: Obstetrics and Gynecology | Admitting: Obstetrics and Gynecology

## 2019-09-03 DIAGNOSIS — Z975 Presence of (intrauterine) contraceptive device: Secondary | ICD-10-CM | POA: Diagnosis not present

## 2019-09-03 DIAGNOSIS — Z793 Long term (current) use of hormonal contraceptives: Secondary | ICD-10-CM | POA: Insufficient documentation

## 2019-09-03 DIAGNOSIS — R87613 High grade squamous intraepithelial lesion on cytologic smear of cervix (HGSIL): Secondary | ICD-10-CM | POA: Diagnosis not present

## 2019-09-03 DIAGNOSIS — F17210 Nicotine dependence, cigarettes, uncomplicated: Secondary | ICD-10-CM | POA: Insufficient documentation

## 2019-09-03 DIAGNOSIS — N870 Mild cervical dysplasia: Secondary | ICD-10-CM | POA: Diagnosis present

## 2019-09-03 DIAGNOSIS — D061 Carcinoma in situ of exocervix: Secondary | ICD-10-CM | POA: Insufficient documentation

## 2019-09-03 HISTORY — PX: CERVICAL CONIZATION W/BX: SHX1330

## 2019-09-03 LAB — POCT PREGNANCY, URINE: Preg Test, Ur: NEGATIVE

## 2019-09-03 SURGERY — CONE BIOPSY, CERVIX
Anesthesia: General | Site: Vagina

## 2019-09-03 MED ORDER — PROPOFOL 10 MG/ML IV BOLUS
INTRAVENOUS | Status: DC | PRN
  Administered 2019-09-03: 150 mg via INTRAVENOUS

## 2019-09-03 MED ORDER — LIDOCAINE HCL (CARDIAC) PF 100 MG/5ML IV SOSY
PREFILLED_SYRINGE | INTRAVENOUS | Status: DC | PRN
  Administered 2019-09-03: 100 mg via INTRAVENOUS

## 2019-09-03 MED ORDER — FENTANYL CITRATE (PF) 100 MCG/2ML IJ SOLN
INTRAMUSCULAR | Status: AC
  Filled 2019-09-03: qty 2

## 2019-09-03 MED ORDER — OXYCODONE HCL 5 MG/5ML PO SOLN: 5.0000 mg | Freq: Once | ORAL | Status: DC | PRN

## 2019-09-03 MED ORDER — SUGAMMADEX SODIUM 200 MG/2ML IV SOLN
INTRAVENOUS | Status: DC | PRN
  Administered 2019-09-03: 200 mg via INTRAVENOUS

## 2019-09-03 MED ORDER — LACTATED RINGERS IV SOLN: INTRAVENOUS | Status: DC

## 2019-09-03 MED ORDER — ONDANSETRON HCL 4 MG/2ML IJ SOLN
INTRAMUSCULAR | Status: AC
  Filled 2019-09-03: qty 2

## 2019-09-03 MED ORDER — CHLORHEXIDINE GLUCONATE 0.12 % MT SOLN
15.0000 mL | Freq: Once | OROMUCOSAL | Status: AC
  Administered 2019-09-03: 15 mL via OROMUCOSAL

## 2019-09-03 MED ORDER — GLYCOPYRROLATE 0.2 MG/ML IJ SOLN
INTRAMUSCULAR | Status: DC | PRN
  Administered 2019-09-03: .2 mg via INTRAVENOUS

## 2019-09-03 MED ORDER — LACTATED RINGERS IV SOLN: INTRAVENOUS | Status: DC | PRN

## 2019-09-03 MED ORDER — FAMOTIDINE 20 MG PO TABS: 20.0000 mg | ORAL_TABLET | Freq: Once | ORAL | Status: AC

## 2019-09-03 MED ORDER — FENTANYL CITRATE (PF) 100 MCG/2ML IJ SOLN
INTRAMUSCULAR | Status: DC | PRN
  Administered 2019-09-03 (×2): 50 ug via INTRAVENOUS

## 2019-09-03 MED ORDER — LIDOCAINE-EPINEPHRINE 1 %-1:100000 IJ SOLN
INTRAMUSCULAR | Status: AC
  Filled 2019-09-03: qty 1

## 2019-09-03 MED ORDER — ONDANSETRON HCL 4 MG/2ML IJ SOLN
INTRAMUSCULAR | Status: DC | PRN
  Administered 2019-09-03 (×2): 4 mg via INTRAVENOUS

## 2019-09-03 MED ORDER — ORAL CARE MOUTH RINSE: 15.0000 mL | Freq: Once | OROMUCOSAL | Status: AC

## 2019-09-03 MED ORDER — CHLORHEXIDINE GLUCONATE 0.12 % MT SOLN
OROMUCOSAL | Status: AC
  Filled 2019-09-03: qty 15

## 2019-09-03 MED ORDER — FAMOTIDINE 20 MG PO TABS
ORAL_TABLET | ORAL | Status: AC
  Administered 2019-09-03: 20 mg via ORAL
  Filled 2019-09-03: qty 1

## 2019-09-03 MED ORDER — ACETAMINOPHEN 10 MG/ML IV SOLN
INTRAVENOUS | Status: AC
  Filled 2019-09-03: qty 100

## 2019-09-03 MED ORDER — OXYCODONE HCL 5 MG PO TABS: 5.0000 mg | ORAL_TABLET | Freq: Once | ORAL | Status: DC | PRN

## 2019-09-03 MED ORDER — DEXMEDETOMIDINE HCL IN NACL 200 MCG/50ML IV SOLN
INTRAVENOUS | Status: DC | PRN
  Administered 2019-09-03 (×2): 4 ug via INTRAVENOUS

## 2019-09-03 MED ORDER — ACETAMINOPHEN 10 MG/ML IV SOLN
INTRAVENOUS | Status: DC | PRN
  Administered 2019-09-03: 1000 mg via INTRAVENOUS

## 2019-09-03 MED ORDER — KETOROLAC TROMETHAMINE 30 MG/ML IJ SOLN
INTRAMUSCULAR | Status: DC | PRN
  Administered 2019-09-03: 30 mg via INTRAVENOUS

## 2019-09-03 MED ORDER — IBUPROFEN 600 MG PO TABS: 600.0000 mg | ORAL_TABLET | Freq: Four times a day (QID) | ORAL | 0 refills | Status: DC | PRN

## 2019-09-03 MED ORDER — KETOROLAC TROMETHAMINE 30 MG/ML IJ SOLN
INTRAMUSCULAR | Status: AC
  Filled 2019-09-03: qty 1

## 2019-09-03 MED ORDER — MIDAZOLAM HCL 2 MG/2ML IJ SOLN
INTRAMUSCULAR | Status: AC
  Filled 2019-09-03: qty 2

## 2019-09-03 MED ORDER — IODINE STRONG (LUGOLS) 5 % PO SOLN
ORAL | Status: AC
  Filled 2019-09-03: qty 1

## 2019-09-03 MED ORDER — LIDOCAINE-EPINEPHRINE 1 %-1:100000 IJ SOLN
INTRAMUSCULAR | Status: DC | PRN
  Administered 2019-09-03: 10 mL

## 2019-09-03 MED ORDER — IODINE STRONG (LUGOLS) 5 % PO SOLN
ORAL | Status: DC | PRN
  Administered 2019-09-03: 14 mL

## 2019-09-03 MED ORDER — MIDAZOLAM HCL 2 MG/2ML IJ SOLN
INTRAMUSCULAR | Status: DC | PRN
  Administered 2019-09-03: 2 mg via INTRAVENOUS

## 2019-09-03 MED ORDER — FENTANYL CITRATE (PF) 100 MCG/2ML IJ SOLN: 25.0000 ug | INTRAMUSCULAR | Status: DC | PRN

## 2019-09-03 MED ORDER — ROCURONIUM BROMIDE 100 MG/10ML IV SOLN
INTRAVENOUS | Status: DC | PRN
  Administered 2019-09-03: 50 mg via INTRAVENOUS

## 2019-09-03 MED ORDER — DEXAMETHASONE SODIUM PHOSPHATE 10 MG/ML IJ SOLN
INTRAMUSCULAR | Status: DC | PRN
  Administered 2019-09-03: 10 mg via INTRAVENOUS

## 2019-09-03 MED ORDER — OXYCODONE-ACETAMINOPHEN 5-325 MG PO TABS: 1.0000 | ORAL_TABLET | ORAL | 0 refills | Status: DC | PRN

## 2019-09-03 SURGICAL SUPPLY — 28 items
APPLICATOR COTTON TIP 6 STRL (MISCELLANEOUS) ×6 IMPLANT
APPLICATOR COTTON TIP 6IN STRL (MISCELLANEOUS) ×18
BLADE SURG SZ11 CARB STEEL (BLADE) ×3 IMPLANT
CATH ROBINSON RED A/P 16FR (CATHETERS) ×3 IMPLANT
DRAPE PERI LITHO V/GYN (MISCELLANEOUS) ×3 IMPLANT
DRAPE UNDER BUTTOCK W/FLU (DRAPES) ×3 IMPLANT
DRSG TELFA 3X8 NADH (GAUZE/BANDAGES/DRESSINGS) ×3 IMPLANT
ELECT LEEP BALL 5MM 12CM (MISCELLANEOUS) ×3
ELECT REM PT RETURN 9FT ADLT (ELECTROSURGICAL) ×3
ELECTRODE LEEP BALL 5MM 12CM (MISCELLANEOUS) ×1 IMPLANT
ELECTRODE REM PT RTRN 9FT ADLT (ELECTROSURGICAL) ×1 IMPLANT
GAUZE 4X4 16PLY RFD (DISPOSABLE) ×3 IMPLANT
GLOVE BIO SURGEON STRL SZ7 (GLOVE) ×3 IMPLANT
GLOVE INDICATOR 7.5 STRL GRN (GLOVE) ×3 IMPLANT
GOWN STRL REUS W/ TWL LRG LVL3 (GOWN DISPOSABLE) ×2 IMPLANT
GOWN STRL REUS W/TWL LRG LVL3 (GOWN DISPOSABLE) ×4
HEMOSTAT SURGICEL 2X3 (HEMOSTASIS) ×3 IMPLANT
KIT TURNOVER CYSTO (KITS) ×3 IMPLANT
LABEL OR SOLS (LABEL) ×3 IMPLANT
NEEDLE HYPO 25GX1 SAFETY (NEEDLE) ×3 IMPLANT
NEEDLE SPNL 22GX3.5 QUINCKE BK (NEEDLE) ×3 IMPLANT
PACK BASIN MINOR (MISCELLANEOUS) ×3 IMPLANT
PAD OB MATERNITY 4.3X12.25 (PERSONAL CARE ITEMS) ×3 IMPLANT
PAD PREP 24X41 OB/GYN DISP (PERSONAL CARE ITEMS) ×3 IMPLANT
SUT SILK 2 0 PERMA HAND 18 BK (SUTURE) ×3 IMPLANT
SUT VIC AB 0 CT1 36 (SUTURE) ×6 IMPLANT
SYR 10ML LL (SYRINGE) ×3 IMPLANT
SYR CONTROL 10ML LL (SYRINGE) ×3 IMPLANT

## 2019-09-03 NOTE — Transfer of Care (Signed)
Immediate Anesthesia Transfer of Care Note  Patient: Tammy Russo  Procedure(s) Performed: CONIZATION CERVIX WITH BIOPSY (N/A Vagina )  Patient Location: PACU  Anesthesia Type:General  Level of Consciousness: awake, alert  and drowsy  Airway & Oxygen Therapy: Patient Spontanous Breathing and Patient connected to face mask oxygen  Post-op Assessment: Report given to RN and Post -op Vital signs reviewed and stable  Post vital signs: Reviewed and stable  Last Vitals:  Vitals Value Taken Time  BP 144/95 09/03/19 1228  Temp    Pulse 75 09/03/19 1231  Resp    SpO2 99 % 09/03/19 1231  Vitals shown include unvalidated device data.  Last Pain:  Vitals:   09/03/19 1018  TempSrc: Oral  PainSc: 0-No pain         Complications: No complications documented.

## 2019-09-03 NOTE — Anesthesia Procedure Notes (Signed)
Procedure Name: Intubation Performed by: Fletcher-Harrison, Dawit Tankard, CRNA Pre-anesthesia Checklist: Patient identified, Emergency Drugs available, Suction available and Patient being monitored Patient Re-evaluated:Patient Re-evaluated prior to induction Oxygen Delivery Method: Circle system utilized Preoxygenation: Pre-oxygenation with 100% oxygen Induction Type: IV induction Ventilation: Mask ventilation without difficulty Laryngoscope Size: McGraph and 3 Tube type: Oral Tube size: 6.5 mm Number of attempts: 1 Airway Equipment and Method: Stylet and Oral airway Placement Confirmation: ETT inserted through vocal cords under direct vision,  positive ETCO2,  breath sounds checked- equal and bilateral and CO2 detector Secured at: 21 cm Tube secured with: Tape Dental Injury: Teeth and Oropharynx as per pre-operative assessment        

## 2019-09-03 NOTE — Op Note (Signed)
Preoperative Diagnosis: 1) 34 y.o. 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  She understands that cryotherapy is a minor procedure with approximately a 85% success rate.  She also understands that cone biopsy is outpatient surgery with a 95% success rate and approximately a 5% recurrence rate.  She understands that this is surgery and has all the risks of any surgery including but not limited to the risks of anesthesia, hemorrhage, infection, perforation, injury to bowel, bladder and blood vessels.  She also understands the unlikely but potential effect on both getting pregnant and the ability of carrying a pregnancy.  Hysterectomy is the final option with a 99% success rate.  After careful discussion of all these options and her unique factors, mutual decision has been made to proceed with a cone biopsy   Surgeon: [name], MD  Anesthesia: General  Preoperative Antibiotics: none  Estimated Blood Loss: 19mL  IV Fluids: 930mL  Urine Output:: 172mL  Drains or Tubes: none  Implants: none  Specimens Removed: none  Complications: none  Intraoperative Findings: Normal appearing cervix, with IUD string visualized  Patient Condition: stable  Procedure in Detail:  Patient was taken to the operating room where she was administered general anesthesia.  She was positioned in the dorsal lithotomy position utilizing candy cane stirups, prepped and draped in the usual sterile fashion.  Prior to proceeding with procedure a time out was performed.   Attention was turned to the patient's pelvis.  A red rubber catheter was used to empty the patient's bladder.  An operative speculum was placed to allow visualization of the cervix.  Stay sutures of 0 Vicryl were placed in the vaginal fornix at 10 and 3 O'Clock,  The cervix was injected with 60mL of lidocaine with epinephrine (1:1000).  A spinal needle cap was used to delineate the cervical canal and attempt to protect the IUD stringsl.The anterior lip of the cervix was grasped with a single tooth tenaculum, and a circumferential incision was scored in the ectocervix.  The edges of the specimen were grasped with an alice clamp and the incision was carried cephalad circumferentially heading towards the spinal needle cap.   After excision of the cone specimen endocervical curettage was obtained.  The Mirena IUD strings were cut in the process of obtaining the cone specimen. ECC specimen was obtained.  The cone bed was cauterized using a roller ball.  One sheet of sugicell was placed in the cone bed.  The stay sutures were tied together in the midline to help hold the surgicell in place.    Sponge needle and instrument counts were correct time two.  The patient tolerated the procedure well and was taken to the recovery room in stable condition.

## 2019-09-03 NOTE — Anesthesia Postprocedure Evaluation (Signed)
Anesthesia Post Note  Patient: Tammy Russo  Procedure(s) Performed: CONIZATION CERVIX WITH BIOPSY (N/A Vagina )  Patient location during evaluation: PACU Anesthesia Type: General Level of consciousness: awake and alert Pain management: pain level controlled Vital Signs Assessment: post-procedure vital signs reviewed and stable Respiratory status: spontaneous breathing, nonlabored ventilation, respiratory function stable and patient connected to nasal cannula oxygen Cardiovascular status: blood pressure returned to baseline and stable Postop Assessment: no apparent nausea or vomiting Anesthetic complications: no   No complications documented.   Last Vitals:  Vitals:   09/03/19 1018 09/03/19 1230  BP: 123/86 (!) 144/95  Pulse: 83 79  Resp: 16 17  Temp: 36.7 C (!) 36.2 C  SpO2:  100%    Last Pain:  Vitals:   09/03/19 1230  TempSrc:   PainSc: 0-No pain                 Precious Haws Talen Poser

## 2019-09-03 NOTE — Anesthesia Preprocedure Evaluation (Signed)
Anesthesia Evaluation  Patient identified by MRN, date of birth, ID band Patient awake    Reviewed: Allergy & Precautions, H&P , NPO status , Patient's Chart, lab work & pertinent test results  History of Anesthesia Complications Negative for: history of anesthetic complications  Airway Mallampati: II  TM Distance: >3 FB Neck ROM: full    Dental  (+) Chipped, Poor Dentition   Pulmonary neg shortness of breath, Current Smoker and Patient abstained from smoking.,    Pulmonary exam normal        Cardiovascular Exercise Tolerance: Good (-) angina(-) Past MI and (-) DOE negative cardio ROS Normal cardiovascular exam     Neuro/Psych negative neurological ROS  negative psych ROS   GI/Hepatic negative GI ROS, Neg liver ROS, neg GERD  ,  Endo/Other  negative endocrine ROS  Renal/GU      Musculoskeletal   Abdominal   Peds  Hematology negative hematology ROS (+)   Anesthesia Other Findings History reviewed. No pertinent past medical history.  Past Surgical History: 2014: INTRAUTERINE DEVICE (IUD) INSERTION     Comment:  Mirena  BMI    Body Mass Index: 29.95 kg/m      Reproductive/Obstetrics negative OB ROS                             Anesthesia Physical Anesthesia Plan  ASA: II  Anesthesia Plan: General LMA   Post-op Pain Management:    Induction: Intravenous  PONV Risk Score and Plan: Dexamethasone, Ondansetron, Midazolam and Treatment may vary due to age or medical condition  Airway Management Planned: LMA  Additional Equipment:   Intra-op Plan:   Post-operative Plan: Extubation in OR  Informed Consent: I have reviewed the patients History and Physical, chart, labs and discussed the procedure including the risks, benefits and alternatives for the proposed anesthesia with the patient or authorized representative who has indicated his/her understanding and acceptance.      Dental Advisory Given  Plan Discussed with: Anesthesiologist, CRNA and Surgeon  Anesthesia Plan Comments: (Patient consented for risks of anesthesia including but not limited to:  - adverse reactions to medications - damage to eyes, teeth, lips or other oral mucosa - nerve damage due to positioning  - sore throat or hoarseness - Damage to heart, brain, nerves, lungs, other parts of body or loss of life  Patient voiced understanding.)        Anesthesia Quick Evaluation

## 2019-09-03 NOTE — OR Nursing (Signed)
IV left arm d/c'd postop, gauze/paper tape applied; IV not documented in epic

## 2019-09-03 NOTE — Interval H&P Note (Signed)
History and Physical Interval Note:  09/03/2019 11:00 AM  Tammy Russo  has presented today for surgery, with the diagnosis of High grade cervical dysplasia R87.613.  The various methods of treatment have been discussed with the patient and family. After consideration of risks, benefits and other options for treatment, the patient has consented to  Procedure(s): CONIZATION CERVIX WITH BIOPSY (N/A) as a surgical intervention.  The patient's history has been reviewed, patient examined, no change in status, stable for surgery.  I have reviewed the patient's chart and labs.  Questions were answered to the patient's satisfaction.     [name]

## 2019-09-03 NOTE — Discharge Instructions (Signed)

## 2019-09-04 ENCOUNTER — Encounter: Payer: Self-pay | Admitting: Obstetrics and Gynecology

## 2019-09-07 LAB — SURGICAL PATHOLOGY

## 2019-09-14 ENCOUNTER — Ambulatory Visit: Payer: Self-pay | Admitting: Obstetrics and Gynecology

## 2019-09-21 ENCOUNTER — Ambulatory Visit (INDEPENDENT_AMBULATORY_CARE_PROVIDER_SITE_OTHER): Payer: Medicaid Other | Admitting: Obstetrics and Gynecology

## 2019-09-21 ENCOUNTER — Encounter: Payer: Self-pay | Admitting: Obstetrics and Gynecology

## 2019-09-21 ENCOUNTER — Other Ambulatory Visit: Payer: Self-pay

## 2019-09-21 VITALS — BP 126/92 | HR 75 | Wt 187.0 lb

## 2019-09-21 DIAGNOSIS — B356 Tinea cruris: Secondary | ICD-10-CM

## 2019-09-21 DIAGNOSIS — Z4889 Encounter for other specified surgical aftercare: Secondary | ICD-10-CM

## 2019-09-21 MED ORDER — NYSTATIN 100000 UNIT/GM EX POWD: 1.0000 "application " | Freq: Three times a day (TID) | CUTANEOUS | 0 refills | Status: AC

## 2019-09-21 NOTE — Progress Notes (Signed)
Postoperative Follow-up Patient presents post op from [name] ago for cervical dysplasia.  Subjective: Patient reports marked improvement in her preop symptoms. Eating a regular diet without difficulty. Pain is controlled without any medications.  Activity: normal activities of daily living.  Does report irritation in groin creases  Objective: Blood pressure (!) 126/92, pulse 75, weight 187 lb (84.8 kg).  General: NAD Pulmonary: no increased work of breathing Abdomen: soft, non-tender, non-distended, incision(s) D/C/I GU: normal external female genitalia. Some erythema in bilateral groin creases R>L Extremities: no edema Neurologic: normal gait    Admission on 09/03/2019, Discharged on 09/03/2019  Component Date Value Ref Range Status  . Preg Test, Ur 09/03/2019 NEGATIVE  NEGATIVE Final   Comment:        THE SENSITIVITY OF THIS METHODOLOGY IS >24 mIU/mL   . SURGICAL PATHOLOGY 09/03/2019    Final-Edited                   Value:SURGICAL PATHOLOGY CASE: [id number] PATIENT: Tammy Russo Surgical Pathology Report     Specimen Submitted: A. Cervical cone; bx B. 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 - HIGH-GRADE DYSPLASIA IS PRESENT AT THE INKED ENDOCERVICAL MARGIN. - ECTOCERVICAL MARGINS ARE NEGATIVE FOR DYSPLASIA.  B. ENDOCERVIX, CURETTAGE: - SQUAMOUS MUCOSA, SUSPICIOUS FOR HIGH-GRADE DYSPLASIA. - SEE COMMENT.  Comment: Examination is hindered by extensive cautery / procedural artifact.   GROSS DESCRIPTION: A. Labeled: Cervical cone biopsy 12:00 margins Received: Formalin Size: 1.7 x 1.7 cm excised to a depth of 3.6 cm; 2.6 x 2 x 0.8 cm Ectocervix: The ectocervix is tan-pink, smooth, and pearly.  There is a suture indicating 12:00. Endocervix: The endocervix is tan-pink, mucoid, and finely  granular. There is a 1.4 cm slit like e                         ndocervical os.  Additionally received in the same container is an unoriented fragment of white-pink firm tissue. A distinct portion of ectocervix or endocervix is not grossly appreciated. Inking scheme: The ectocervix is inked blue and the endocervix is inked black.  The additional fragment is entirely inked green.  Block summary: 1 - 3 - ectocervix/endocervix, 12-3:00 4 - 6 - ectocervix/endocervix, 3-6:00 7 - 9 - ectocervix/endocervix, 6-9:00 10 - 11 - ectocervix/endocervix, 9-12:00 12 - 19 - additional fragment, serially sectioned, submitted entirely and sequentially  B. Labeled: Endocervical curettings Received: Formalin Tissue fragment(s): Multiple Size: Aggregate, 2 x 0.4 x 0.1 cm Description: Received on a Telfa pad are fragments of red-brown soft tissue. Entirely submitted in 1 cassette.   Final Diagnosis performed by Tammy Pries, MD.   Electronically signed 09/07/2019 3:04:38PM The electronic signature indicates that the named Attending Pathologist                          has evaluated the specimen Technical component performed at [facility], [address] [place], [place] [address] Lab: [phone] Dir: Tammy Farmer, MD, MMM  Professional component performed at [facility], [facility], [place], [place], [place] [address] Lab: [phone] Dir: Tammy Russo. Rubinas, MD      Assessment: 34 y.o. s/p CK[facility] stable  Plan: Patient has done well after surgery with no apparent complications.  I have discussed the post-operative course to date, and  the expected progress moving forward.  The patient understands what complications to be concerned about.  I will see the patient in routine follow up, or sooner if needed.    Activity plan: no intercourse, no baths  Tinea cruris Rx nystatin powder  Plan for repeat colposcopy and ECC in 6 months given positive margins   Tammy Mood,  MD, [place], [facility] 09/21/2019, 10:56 AM

## 2019-10-28 ENCOUNTER — Ambulatory Visit (INDEPENDENT_AMBULATORY_CARE_PROVIDER_SITE_OTHER): Payer: Medicaid Other | Admitting: Obstetrics and Gynecology

## 2019-10-28 ENCOUNTER — Encounter: Payer: Self-pay | Admitting: Obstetrics and Gynecology

## 2019-10-28 ENCOUNTER — Other Ambulatory Visit: Payer: Self-pay

## 2019-10-28 VITALS — BP 118/84 | HR 82 | Ht 65.0 in | Wt 189.0 lb

## 2019-10-28 DIAGNOSIS — Z4889 Encounter for other specified surgical aftercare: Secondary | ICD-10-CM

## 2019-10-28 NOTE — Progress Notes (Signed)
Postoperative Follow-up Patient presents post op from [facility] 6weeks ago for CIN III.  Subjective: Patient reports marked improvement in her preop symptoms. Eating a regular diet without difficulty. The patient is not having any pain.  Activity: normal activities of daily living.  Objective: Blood pressure 118/84, pulse 82, height 5\' 5"  (1.651 m), weight 189 lb (85.7 kg).  General: NAD Pulmonary: no increased work of breathing GU: normal external female genitalia, cone bed well healed Extremities: no edema Neurologic: normal gait     Admission on 09/03/2019, Discharged on 09/03/2019  Component Date Value Ref Range Status  . Preg Test, Ur 09/03/2019 NEGATIVE  NEGATIVE Final   Comment:        THE SENSITIVITY OF THIS METHODOLOGY IS >24 mIU/mL   . SURGICAL PATHOLOGY 09/03/2019    Final-Edited                   Value:SURGICAL PATHOLOGY CASE: [id number] PATIENT: Irish Lack Surgical Pathology Report     Specimen Submitted: A. Cervical cone; bx B. 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 - HIGH-GRADE DYSPLASIA IS PRESENT AT THE INKED ENDOCERVICAL MARGIN. - ECTOCERVICAL MARGINS ARE NEGATIVE FOR DYSPLASIA.  B. ENDOCERVIX, CURETTAGE: - SQUAMOUS MUCOSA, SUSPICIOUS FOR HIGH-GRADE DYSPLASIA. - SEE COMMENT.  Comment: Examination is hindered by extensive cautery / procedural artifact.   GROSS DESCRIPTION: A. Labeled: Cervical cone biopsy 12:00 margins Received: Formalin Size: 1.7 x 1.7 cm excised to a depth of 3.6 cm; 2.6 x 2 x 0.8 cm Ectocervix: The ectocervix is tan-pink, smooth, and pearly.  There is a suture indicating 12:00. Endocervix: The endocervix is tan-pink, mucoid, and finely granular. There is a 1.4 cm slit like e                         ndocervical os.  Additionally received in the same container is an  unoriented fragment of white-pink firm tissue. A distinct portion of ectocervix or endocervix is not grossly appreciated. Inking scheme: The ectocervix is inked blue and the endocervix is inked black.  The additional fragment is entirely inked green.  Block summary: 1 - 3 - ectocervix/endocervix, 12-3:00 4 - 6 - ectocervix/endocervix, 3-6:00 7 - 9 - ectocervix/endocervix, 6-9:00 10 - 11 - ectocervix/endocervix, 9-12:00 12 - 19 - additional fragment, serially sectioned, submitted entirely and sequentially  B. Labeled: Endocervical curettings Received: Formalin Tissue fragment(s): Multiple Size: Aggregate, 2 x 0.4 x 0.1 cm Description: Received on a Telfa pad are fragments of red-brown soft tissue. Entirely submitted in 1 cassette.   Final Diagnosis performed by [name], MD.   Electronically signed 09/07/2019 3:04:38PM The electronic signature indicates that the named Attending Pathologist                          has evaluated the specimen Technical component performed at [facility], [address] [place],  [address] Lab: [phone] Dir: [name], MD, MMM  Professional component performed at [facility], [facility], [place], [place],  [address] Lab: [phone] Dir: [name], MD     Assessment: 34 y.o. s/p CKC stable  Plan: Patient has done well after surgery with no apparent complications.  I have discussed the post-operative course to date, and the expected progress moving forward.  The patient understands what complications  to be concerned about.  I will see the patient in routine follow up, or sooner if needed.    Activity plan: No restriction.  - Repeat cytology and ECC in 6 months from date of [place], MD, [place], [facility] 10/28/2019, 8:59 AM

## 2020-02-15 ENCOUNTER — Encounter: Payer: Self-pay | Admitting: *Deleted

## 2020-02-15 ENCOUNTER — Emergency Department: Payer: Medicaid Other

## 2020-02-15 ENCOUNTER — Other Ambulatory Visit: Payer: Self-pay

## 2020-02-15 ENCOUNTER — Emergency Department
Admission: EM | Admit: 2020-02-15 | Discharge: 2020-02-15 | Disposition: A | Discharge status: Home / Self Care | Payer: Medicaid Other | Attending: Student in an Organized Health Care Education/Training Program | Admitting: Student in an Organized Health Care Education/Training Program

## 2020-02-15 DIAGNOSIS — F17210 Nicotine dependence, cigarettes, uncomplicated: Secondary | ICD-10-CM | POA: Diagnosis not present

## 2020-02-15 DIAGNOSIS — U071 COVID-19: Secondary | ICD-10-CM | POA: Diagnosis not present

## 2020-02-15 DIAGNOSIS — Z1152 Encounter for screening for COVID-19: Secondary | ICD-10-CM

## 2020-02-15 DIAGNOSIS — B349 Viral infection, unspecified: Secondary | ICD-10-CM | POA: Insufficient documentation

## 2020-02-15 DIAGNOSIS — M791 Myalgia, unspecified site: Secondary | ICD-10-CM | POA: Diagnosis present

## 2020-02-15 LAB — BASIC METABOLIC PANEL
Anion gap: 11 (ref 5–15)
BUN: 7 mg/dL (ref 6–20)
CO2: 19 mmol/L — ABNORMAL LOW (ref 22–32)
Calcium: 8.9 mg/dL (ref 8.9–10.3)
Chloride: 107 mmol/L (ref 98–111)
Creatinine, Ser: 0.67 mg/dL (ref 0.44–1.00)
GFR, Estimated: >60 mL/min (ref >60)
Glucose, Bld: 103 mg/dL — ABNORMAL HIGH (ref 70–99)
Potassium: 3.9 mmol/L (ref 3.5–5.1)
Sodium: 137 mmol/L (ref 135–145)

## 2020-02-15 LAB — POC URINE PREG, ED: Preg Test, Ur: NEGATIVE

## 2020-02-15 LAB — CBC
HCT: 35.9 % — ABNORMAL LOW (ref 36.0–46.0)
Hemoglobin: 11.6 g/dL — ABNORMAL LOW (ref 12.0–15.0)
MCH: 26.9 pg (ref 26.0–34.0)
MCHC: 32.3 g/dL (ref 30.0–36.0)
MCV: 83.3 fL (ref 80.0–100.0)
Platelets: 267 10*3/uL (ref 150–400)
RBC: 4.31 MIL/uL (ref 3.87–5.11)
RDW: 15.2 % (ref 11.5–15.5)
WBC: 5.5 10*3/uL (ref 4.0–10.5)
nRBC: 0 % (ref 0.0–0.2)

## 2020-02-15 LAB — TROPONIN I (HIGH SENSITIVITY): Troponin I (High Sensitivity): 3 ng/L (ref <18)

## 2020-02-15 LAB — SARS CORONAVIRUS 2 (TAT 6-24 HRS): SARS Coronavirus 2: POSITIVE — AB

## 2020-02-15 MED ORDER — ONDANSETRON 4 MG PO TBDP
4.0000 mg | ORAL_TABLET | Freq: Once | ORAL | Status: AC
  Administered 2020-02-15: 4 mg via ORAL
  Filled 2020-02-15: qty 1

## 2020-02-15 NOTE — ED Triage Notes (Signed)
Pt to ED reporting generalized body aches, cough and congestion starting yesterday with a known exposure to COVID. Pt tearful in triage reporting SOB and chest pains have been worsening throughout the night with dizziness when standing. PPt able ot speak in complete sentences. No fever in triage.

## 2020-02-15 NOTE — ED Provider Notes (Signed)
[facility] Emergency Department Provider Note  ____________________________________________   Event Date/Time   First MD Initiated Contact with Patient 02/15/20 [phone]     (approximate)  I have reviewed the triage vital signs and the nursing notes.   HISTORY  Chief Complaint Shortness of Breath and Chest Pain   HPI Tammy Russo is a 35 y.o. female Libby Maw to the ED with complaint of generalized body aches, cough and congestion that started yesterday.  Patient states she woke with symptoms that got generally worse throughout the day.  She reports some shortness of breath and chest pain along with dizziness.  Patient reports that she did have a vaccine in April but does not remember which one she got.  She states that there were 2 injections but she has not yet got the booster.  She reports being exposed to someone at work who was positive recently.  She rates her pain as 10/10.     History reviewed. No pertinent past medical history.  Patient Active Problem List   Diagnosis Date Noted  . High grade squamous intraepithelial cervical dysplasia 08/12/2019    Past Surgical History:  Procedure Laterality Date  . CERVICAL CONIZATION W/BX N/A 09/03/2019   Procedure: CONIZATION CERVIX WITH BIOPSY;  Surgeon: [name], MD;  Location: [facility] ORS;  Service: Gynecology;  Laterality: N/A;  . INTRAUTERINE DEVICE (IUD) INSERTION  2014   Mirena    Prior to Admission medications   Medication Sig Start Date End Date Taking? Authorizing Provider  Fe Fum-FA-B Cmp-C-Zn-Mg-Mn-Cu (HEMOCYTE PLUS) 106-1 MG CAPS Take 1 capsule by mouth daily. 06/10/19   Kendell Bane, NP  levonorgestrel (MIRENA) 20 MCG/24HR IUD 1 each by Intrauterine route once.    [provider]  nystatin (MYCOSTATIN/NYSTOP) powder Apply 1 application topically 3 (three) times daily. 09/21/19   [name], MD    Allergies Patient has no known allergies.  History reviewed. No  pertinent family history.  Social History Social History   Tobacco Use  . Smoking status: Current Every Day Smoker    Packs/day: 0.50    Types: Cigarettes  . Smokeless tobacco: Never Used  Vaping Use  . Vaping Use: Never used  Substance Use Topics  . Alcohol use: Yes    Comment: rare  . Drug use: Yes    Types: Marijuana    Review of Systems Constitutional: Subjective fever/chills.  Positive for dizziness. Eyes: No visual changes. ENT: No sore throat.  For congestion. Cardiovascular: Denies chest pain. Respiratory: Denies shortness of breath.  Positive for cough and shortness of breath. Gastrointestinal: No abdominal pain.  Positive nausea, no vomiting.  No diarrhea.  Genitourinary: Negative for dysuria. Musculoskeletal: Positive for generalized body aches. Skin: Negative for rash. Neurological: Negative for headaches, focal weakness or numbness. ____________________________________________   PHYSICAL EXAM:  VITAL SIGNS: ED Triage Vitals  Enc Vitals Group     BP 02/15/20 0607 (!) 148/11     Pulse Rate 02/15/20 0607 94     Resp 02/15/20 0607 20     Temp 02/15/20 0607 99.7 F (37.6 C)     Temp Source 02/15/20 0607 Oral     SpO2 02/15/20 0607 100 %     Weight 02/15/20 0610 180 lb (81.6 kg)     Height 02/15/20 0610 5\' 5"  (1.651 m)     Head Circumference --      Peak Flow --      Pain Score 02/15/20 0609 10     Pain  Loc --      Pain Edu? --      Excl. in [place]? --     Constitutional: Alert and oriented. Well appearing and in no acute distress. Eyes: Conjunctivae are normal.  Head: Atraumatic. Nose: Mild congestion/rhinnorhea. Neck: No stridor.   Cardiovascular: Normal rate, regular rhythm. Grossly normal heart sounds.  Good peripheral circulation. Respiratory: Normal respiratory effort.  No retractions. Lungs CTAB. Gastrointestinal: Soft and nontender. No distention.  Bowel sounds normoactive x4 quadrants. Musculoskeletal: Patient is able move upper and lower  extremities with any difficulty and is ambulatory without any assistance. Neurologic:  Normal speech and language. No gross focal neurologic deficits are appreciated. No gait instability. Skin:  Skin is warm, dry and intact. No rash noted. Psychiatric: Mood and affect are normal. Speech and behavior are normal.  ____________________________________________   LABS (all labs ordered are listed, but only abnormal results are displayed)  Labs Reviewed  BASIC METABOLIC PANEL - Abnormal; Notable for the following components:      Result Value   CO2 19 (*)    Glucose, Bld 103 (*)    All other components within normal limits  CBC - Abnormal; Notable for the following components:   Hemoglobin 11.6 (*)    HCT 35.9 (*)    All other components within normal limits  SARS CORONAVIRUS 2 (TAT 6-24 HRS)  POC URINE PREG, ED  TROPONIN I (HIGH SENSITIVITY)  TROPONIN I (HIGH SENSITIVITY)   ____________________________________________  EKG EKG was reviewed by doctors on major ED. Normal sinus rhythm with ventricular rate of 80  ____________________________________________  RADIOLOGY I, Johnn Hai, personally viewed and evaluated these images (plain radiographs) as part of my medical decision making, as well as reviewing the written report by the radiologist.   Official radiology report(s): DG Chest 2 View  Result Date: 02/15/2020 CLINICAL DATA:  Shortness of breath.  COVID exposure EXAM: CHEST - 2 VIEW COMPARISON:  None. FINDINGS: Normal heart size and mediastinal contours. Artifact from EKG pads. No acute infiltrate or edema. No effusion or pneumothorax. No acute osseous findings. IMPRESSION: No visible pneumonia. Electronically Signed   By: Monte Fantasia M.D.   On: 02/15/2020 07:07    ____________________________________________   PROCEDURES  Procedure(s) performed (including Critical Care):  Procedures   ____________________________________________   INITIAL IMPRESSION /  ASSESSMENT AND PLAN / ED COURSE  As part of my medical decision making, I reviewed the following data within the electronic MEDICAL RECORD NUMBER Notes from prior ED visits and [place] Controlled Substance Database  35 year old female presents to the ED with complaint of muscle aches, chills, dizziness, cough and congestion that started yesterday.  Patient reports subjective fever at home.  She reports recent exposure to known Covid at work.  Patient did get did get a Covid vaccine but is not certain of the name.  Chest x-ray, EKG, lab work were all within normal limits.  Patient is aware that the Covid test may take 6 to 24 hours and results can be seen in my chart.  She was given a note to remain out of work for 2 days.  She is aware that since she was vaccinated she can return to work in 5 days but still need to wear her mask correctly.  She is to follow-up with her PCP if any continued problems.  Return to the emergency department if any severe worsening of her symptoms such as difficulty breathing or shortness of breath.  ____________________________________________   FINAL CLINICAL IMPRESSION(S) /  ED DIAGNOSES  Final diagnoses:  Viral illness  Encounter for screening for COVID-19     ED Discharge Orders    None      *Please note:  Tammy Russo was evaluated in Emergency Department on 02/15/2020 for the symptoms described in the history of present illness. She was evaluated in the context of the global COVID-19 pandemic, which necessitated consideration that the patient might be at risk for infection with the SARS-CoV-2 virus that causes COVID-19. Institutional protocols and algorithms that pertain to the evaluation of patients at risk for COVID-19 are in a state of rapid change based on information released by regulatory bodies including the CDC and federal and state organizations. These policies and algorithms were followed during the patient's care in the ED.  Some ED evaluations and  interventions may be delayed as a result of limited staffing during and the pandemic.*   Note:  This document was prepared using Dragon voice recognition software and may include unintentional dictation errors.    Johnn Hai, PA-C 02/15/20 0813    Merlyn Lot, MD 02/15/20 1116

## 2020-02-15 NOTE — ED Notes (Signed)
Patient verbalizes understanding of discharge instructions. Opportunity for questioning and answers were provided. Armband removed by staff, pt discharged from ED. Ambulated out to lobby  

## 2020-02-15 NOTE — Discharge Instructions (Addendum)
Follow-up with your primary care provider if any continued problems or questions.  Return to the emergency department if any severe worsening of your symptoms such as difficulty breathing or shortness of breath.  Covid test takes from 6 to 24 hours.  You may take Tylenol or ibuprofen as needed for fever, headache and body aches.  Increase fluids to stay hydrated.  Read information about Covid symptoms and managing your symptoms at home.  Your results can be seen on my chart in 6 to 24 hours.  If your test results are positive you may return to work in 5 days, since you have been vaccinated if you have decreased symptoms and no fever.  You must however wear a mask correctly when you return to work.

## 2020-02-16 ENCOUNTER — Ambulatory Visit (INDEPENDENT_AMBULATORY_CARE_PROVIDER_SITE_OTHER): Payer: Medicaid Other | Admitting: Internal Medicine

## 2020-02-16 ENCOUNTER — Encounter: Payer: Self-pay | Admitting: Internal Medicine

## 2020-02-16 ENCOUNTER — Other Ambulatory Visit: Payer: Self-pay | Admitting: Family

## 2020-02-16 ENCOUNTER — Telehealth (HOSPITAL_COMMUNITY): Payer: Self-pay | Admitting: Family

## 2020-02-16 VITALS — Temp 98.4°F | Resp 16 | Ht 65.0 in | Wt 185.0 lb

## 2020-02-16 DIAGNOSIS — U071 COVID-19: Secondary | ICD-10-CM

## 2020-02-16 DIAGNOSIS — J208 Acute bronchitis due to other specified organisms: Secondary | ICD-10-CM | POA: Diagnosis not present

## 2020-02-16 MED ORDER — MOLNUPIRAVIR EUA 200MG CAPSULE: 4.0000 | ORAL_CAPSULE | Freq: Two times a day (BID) | ORAL | 0 refills | Status: AC

## 2020-02-16 MED ORDER — ALBUTEROL SULFATE HFA 108 (90 BASE) MCG/ACT IN AERS: 2.0000 | INHALATION_SPRAY | Freq: Four times a day (QID) | RESPIRATORY_TRACT | 0 refills | Status: AC | PRN

## 2020-02-16 MED ORDER — AZITHROMYCIN 250 MG PO TABS: ORAL_TABLET | ORAL | 0 refills | Status: DC

## 2020-02-16 NOTE — Telephone Encounter (Signed)
Outpatient Oral COVID Treatment Note  I connected with Tammy Russo on 02/16/2020/8:05 PM by telephone and verified that I am speaking with the correct person using two identifiers.  I discussed the limitations, risks, security, and privacy concerns of performing an evaluation and management service by telephone and the availability of in person appointments. I also discussed with the patient that there may be a patient responsible charge related to this service. The patient expressed understanding and agreed to proceed.  Patient location: Unknown Provider location: Work  Diagnosis: COVID-19 infection  Purpose of visit: Discussion of potential use of Molnupiravir or Paxlovid, a new treatment for mild to moderate COVID-19 viral infection in non-hospitalized patients.   Subjective: Patient is a 35 y.o. female who has been diagnosed with COVID 19 viral infection.  Their symptoms began on 02/14/2020 with cough, fever, aches.    No past medical history on file.  No Known Allergies   Current Outpatient Medications:  .  albuterol (VENTOLIN HFA) 108 (90 Base) MCG/ACT inhaler, Inhale 2 puffs into the lungs every 6 (six) hours as needed for wheezing or shortness of breath., Disp: 8 g, Rfl: 0 .  azithromycin (ZITHROMAX) 250 MG tablet, Take one tab a day for 10 days for uri, Disp: 10 tablet, Rfl: 0 .  Fe Fum-FA-B Cmp-C-Zn-Mg-Mn-Cu (HEMOCYTE PLUS) 106-1 MG CAPS, Take 1 capsule by mouth daily., Disp: 30 capsule, Rfl: 2 .  levonorgestrel (MIRENA) 20 MCG/24HR IUD, 1 each by Intrauterine route once., Disp: , Rfl:  .  nystatin (MYCOSTATIN/NYSTOP) powder, Apply 1 application topically 3 (three) times daily., Disp: 60 g, Rfl: 0  Objective: Patient is in no apparent distress.  Breathing is non labored.  Mood and behavior are normal.  Laboratory Data:  Recent Results (from the past 2160 hour(s))  Basic metabolic panel     Status: Abnormal   Collection Time: 02/15/20  6:12 AM  Result Value Ref Range    Sodium 137 135 - 145 mmol/L   Potassium 3.9 3.5 - 5.1 mmol/L   Chloride 107 98 - 111 mmol/L   CO2 19 (L) 22 - 32 mmol/L   Glucose, Bld 103 (H) 70 - 99 mg/dL    Comment: Glucose reference range applies only to samples taken after fasting for at least 8 hours.   BUN 7 6 - 20 mg/dL   Creatinine, Ser 0.67 0.44 - 1.00 mg/dL   Calcium 8.9 8.9 - 10.3 mg/dL   GFR, Estimated >60 >60 mL/min    Comment: (NOTE) Calculated using the CKD-EPI Creatinine Equation (2021)    Anion gap 11 5 - 15    Comment: Performed at [facility], [address] [place], [place] [address]  CBC     Status: Abnormal   Collection Time: 02/15/20  6:12 AM  Result Value Ref Range   WBC 5.5 4.0 - 10.5 K/uL   RBC 4.31 3.87 - 5.11 MIL/uL   Hemoglobin 11.6 (L) 12.0 - 15.0 g/dL   HCT 35.9 (L) 36.0 - 46.0 %   MCV 83.3 80.0 - 100.0 fL   MCH 26.9 26.0 - 34.0 pg   MCHC 32.3 30.0 - 36.0 g/dL   RDW 15.2 11.5 - 15.5 %   Platelets 267 150 - 400 K/uL   nRBC 0.0 0.0 - 0.2 %    Comment: Performed at [facility], [address] [place], [place] [address]  Troponin I (High Sensitivity)     Status: None   Collection Time: 02/15/20  6:12 AM  Result Value Ref Range   Troponin I (High Sensitivity) 3 <18 ng/L    Comment: (NOTE) Elevated high sensitivity troponin I (hsTnI) values and significant  changes across serial measurements may suggest ACS but many other  chronic and acute conditions are known to elevate hsTnI results.  Refer to the "Links" section for chest pain algorithms and additional  guidance. Performed at [facility], [address] [place], [place] [address]   SARS CORONAVIRUS 2 (TAT 6-24 HRS) Nasopharyngeal Nasopharyngeal Swab     Status: Abnormal   Collection Time: 02/15/20  7:45 AM   Specimen: Nasopharyngeal Swab  Result Value Ref Range   SARS Coronavirus 2 POSITIVE (A) NEGATIVE    Comment: (NOTE) SARS-CoV-2 target nucleic acids are DETECTED.  The SARS-CoV-2 RNA is generally  detectable in upper and lower respiratory specimens during the acute phase of infection. Positive results are indicative of the presence of SARS-CoV-2 RNA. Clinical correlation with patient history and other diagnostic information is  necessary to determine patient infection status. Positive results do not rule out bacterial infection or co-infection with other viruses.  The expected result is Negative.  Fact Sheet for Patients: HairSlick.no  Fact Sheet for Healthcare Providers: quierodirigir.com  This test is not yet approved or cleared by the [place] FDA and  has been authorized for detection and/or diagnosis of SARS-CoV-2 by FDA under an Emergency Use Authorization (EUA). This EUA will remain  in effect (meaning this test can be used) for the duration of the COVID-19 declaration under Section 564(b)(1) of the Act, 21 U. S.C. section 360bbb-3(b)(1), unless the authorization is terminated or revoked sooner.   Performed at [facility], [address], [place], [place] [address]   POC urine preg, ED     Status: None   Collection Time: 02/15/20  7:56 AM  Result Value Ref Range   Preg Test, Ur Negative Negative     Assessment: 35 y.o. female with mild/moderate COVID 19 viral infection diagnosed on 02/15/2020 at high risk for progression to severe COVID 19.  Plan:  This patient is a 35 y.o. female that meets the following criteria for Emergency Use Authorization of: Molnupiravir  1. Age >18 yr 2. SARS-COV-2 positive test 3. Symptom onset < 5 days 4. Mild-to-moderate COVID disease with high risk for severe progression to hospitalization or death   I have spoken and communicated the following to the patient or parent/caregiver regarding: 1. Molnupiravir is an unapproved drug that is authorized for use under an TEFL teacher.  2. There are no adequate, approved, available products for the treatment of  COVID-19 in adults who have mild-to-moderate COVID-19 and are at high risk for progressing to severe COVID-19, including hospitalization or death. 3. Other therapeutics are currently authorized. For additional information on all products authorized for treatment or prevention of COVID-19, please see https://www.graham-miller.com/.  4. There are benefits and risks of taking this treatment as outlined in the "Fact Sheet for Patients and Caregivers."  5. "Fact Sheet for Patients and Caregivers" was reviewed with patient. A hard copy will be provided to patient from pharmacy prior to the patient receiving treatment. 6. Patients should continue to self-isolate and use infection control measures (e.g., wear mask, isolate, social distance, avoid sharing personal items, clean and disinfect "high touch" surfaces, and frequent handwashing) according to CDC guidelines.  7. The patient or parent/caregiver has the option to accept or refuse treatment. 8. [employer] has established a pregnancy surveillance program. 9.  Females of childbearing potential should use a reliable method of contraception correctly and consistently, as applicable, for the duration of treatment and for 4 days after the last dose of Molnupiravir. 79. Males of reproductive potential who are sexually active with females of childbearing potential should use a reliable method of contraception correctly and consistently during treatment and for at least 3 months after the last dose. 11. Pregnancy status and risk was assessed. Patient verbalized understanding of precautions.   After reviewing above information with the patient, the patient agrees to receive molnupiravir. Patient has an IUD.   Follow up instructions:    . Take prescription BID x 5 days as directed . Reach out to pharmacist for counseling on medication if desired . For concerns  regarding further COVID symptoms please follow up with your PCP or urgent care . For urgent or life-threatening issues, seek care at your local emergency department  The patient was provided an opportunity to ask questions, and all were answered. The patient agreed with the plan and demonstrated an understanding of the instructions.   Script sent to [facility] and opted to pick up RX.  The patient was advised to call their PCP or seek an in-person evaluation if the symptoms worsen or if the condition fails to improve as anticipated.   I provided 15 minutes of non face-to-face telephone visit time during this encounter, and > 50% was spent counseling as documented under my assessment & plan.  Asencion Gowda, NP 02/16/2020 Roxanne Mins PM

## 2020-02-16 NOTE — Progress Notes (Signed)
[facility] [place],  [address]  Internal MEDICINE  Telephone Visit  Patient Name: Tammy Russo  [id number]  [id number]  Date of Service: 02/22/2020  I connected with the patient at  by telephone and verified the patients identity using two identifiers.   I discussed the limitations, risks, security and privacy concerns of performing an evaluation and management service by telephone and the availability of in person appointments. I also discussed with the patient that there may be a patient responsible charge related to the service.  The patient expressed understanding and agrees to proceed.    Chief Complaint  Patient presents with  . Telephone Screen    Pt Contact number [phone]  . telephone assesment  . Covid Positive  . Shortness of Breath  . Chest Pain  . Chills  . Generalized Body Aches    HPI Pt is connected for acute and sick visit. She has been tested positive for COVID.  Pt had to go to ED for sick visit. She was not prescribed any medications. Now c/o chest congestion and sob, she feels achy as well  Current Medication: Outpatient Encounter Medications as of 02/16/2020  Medication Sig  . albuterol (VENTOLIN HFA) 108 (90 Base) MCG/ACT inhaler Inhale 2 puffs into the lungs every 6 (six) hours as needed for wheezing or shortness of breath.  Marland Kitchen azithromycin (ZITHROMAX) 250 MG tablet Take one tab a day for 10 days for uri  . Molnupiravir 200 MG CAPS Take 4 capsules (800 mg total) by mouth 2 (two) times daily.  . Fe Fum-FA-B Cmp-C-Zn-Mg-Mn-Cu (HEMOCYTE PLUS) 106-1 MG CAPS Take 1 capsule by mouth daily.  Marland Kitchen levonorgestrel (MIRENA) 20 MCG/24HR IUD 1 each by Intrauterine route once.  . nystatin (MYCOSTATIN/NYSTOP) powder Apply 1 application topically 3 (three) times daily.   No facility-administered encounter medications on file as of 02/16/2020.    Surgical History: Past Surgical History:  Procedure Laterality Date  . CERVICAL  CONIZATION W/BX N/A 09/03/2019   Procedure: CONIZATION CERVIX WITH BIOPSY;  Surgeon: [name], MD;  Location: [facility] ORS;  Service: Gynecology;  Laterality: N/A;  . INTRAUTERINE DEVICE (IUD) INSERTION  2014   Mirena    Medical History: History reviewed. No pertinent past medical history.  Family History: History reviewed. No pertinent family history.  Social History   Socioeconomic History  . Marital status: Single    Spouse name: Not on file  . Number of children: Not on file  . Years of education: Not on file  . Highest education level: Not on file  Occupational History  . Not on file  Tobacco Use  . Smoking status: Former Smoker    Packs/day: 0.50    Types: Cigarettes  . Smokeless tobacco: Never Used  Vaping Use  . Vaping Use: Never used  Substance and Sexual Activity  . Alcohol use: Yes    Comment: rare  . Drug use: Yes    Types: Marijuana  . Sexual activity: Yes    Birth control/protection: I.U.D.  Other Topics Concern  . Not on file  Social History Narrative  . Not on file   Social Determinants of Health   Financial Resource Strain: Not on file  Food Insecurity: Not on file  Transportation Needs: Not on file  Physical Activity: Not on file  Stress: Not on file  Social Connections: Not on file  Intimate Partner Violence: Not on file      Review of Systems  Constitutional: Negative for fatigue and  fever.  HENT: Positive for postnasal drip and rhinorrhea. Negative for congestion and mouth sores.   Respiratory: Positive for cough and shortness of breath.   Cardiovascular: Negative for chest pain.  Genitourinary: Negative for flank pain.  Psychiatric/Behavioral: Negative.     Vital Signs: Temp 98.4 F (36.9 C)   Resp 16   Ht 5\' 5"  (1.651 m)   Wt 185 lb (83.9 kg)   LMP 01/25/2020   BMI 30.79 kg/m    Observation/Objective: Pt sounds congested when coughing. SOB     Assessment/Plan: 1. Acute bronchitis due to COVID-19 virus Pt is  educated about restrictions and Quarantine   - azithromycin (ZITHROMAX) 250 MG tablet; Take one tab a day for 10 days for uri  Dispense: 10 tablet; Refill: 0 - albuterol (VENTOLIN HFA) 108 (90 Base) MCG/ACT inhaler; Inhale 2 puffs into the lungs every 6 (six) hours as needed for wheezing or shortness of breath.  Dispense: 8 g; Refill: 0 COVID-19 Vaccine Information can be found at: ShippingScam.co.uk For questions related to vaccine distribution or appointments, please email vaccine@[email or web address] .[email or web address] or call [phone].   General Counseling: Jasha verbalizes understanding of the findings of today's phone visit and agrees with plan of treatment. I have discussed any further diagnostic evaluation that may be needed or ordered today. We also reviewed her medications today. she has been encouraged to call the office with any questions or concerns that should arise related to todays visit.   Meds ordered this encounter  Medications  . azithromycin (ZITHROMAX) 250 MG tablet    Sig: Take one tab a day for 10 days for uri    Dispense:  10 tablet    Refill:  0  . albuterol (VENTOLIN HFA) 108 (90 Base) MCG/ACT inhaler    Sig: Inhale 2 puffs into the lungs every 6 (six) hours as needed for wheezing or shortness of breath.    Dispense:  8 g    Refill:  0  . Molnupiravir 200 MG CAPS    Sig: Take 4 capsules (800 mg total) by mouth 2 (two) times daily.    Dispense:  40 capsule    Refill:  0    Time spent:12 Minutes    Dr Lavera Guise Internal medicine

## 2020-02-17 ENCOUNTER — Telehealth: Payer: Self-pay | Admitting: Pharmacist

## 2020-02-17 NOTE — Telephone Encounter (Signed)
Patient was prescribed oral covid treatment molnupiravir and treatment note was reviewed. Medication has been received by [place] and reviewed for appropriateness.  No Drug Interactions or Dosage Adjustments Noted:   Delivery Method: to be picked up by patient and delivered to car  Patient contacted for counseling on 02/17/2020 and verbalized understanding.  Discussed contraception for next month  Pick-Up Date: 02/17/2020 an delivered to patient in her car.   Letta Median 02/17/2020, 12:59 PM [facility] Outpatient Pharmacist Phone# [phone]

## 2020-02-22 MED ORDER — MOLNUPIRAVIR 200 MG PO CAPS: 800.0000 mg | ORAL_CAPSULE | Freq: Two times a day (BID) | ORAL | 0 refills | Status: DC

## 2020-03-28 ENCOUNTER — Ambulatory Visit: Payer: Medicaid Other | Admitting: Obstetrics and Gynecology

## 2020-12-20 NOTE — Telephone Encounter (Signed)
Mirena rcvd/charged 08/03/2019

## 2021-01-12 ENCOUNTER — Encounter: Payer: Self-pay | Admitting: Internal Medicine

## 2021-03-27 ENCOUNTER — Ambulatory Visit: Payer: Medicaid Other | Admitting: Nurse Practitioner

## 2021-03-27 ENCOUNTER — Other Ambulatory Visit: Payer: Self-pay

## 2021-03-27 ENCOUNTER — Telehealth: Payer: Self-pay

## 2021-03-27 ENCOUNTER — Encounter: Payer: Self-pay | Admitting: Physician Assistant

## 2021-03-27 VITALS — BP 126/90 | HR 80 | Temp 97.5°F | Resp 16 | Ht 65.0 in | Wt 165.4 lb

## 2021-03-27 DIAGNOSIS — E782 Mixed hyperlipidemia: Secondary | ICD-10-CM

## 2021-03-27 DIAGNOSIS — D508 Other iron deficiency anemias: Secondary | ICD-10-CM | POA: Diagnosis not present

## 2021-03-27 DIAGNOSIS — H0014 Chalazion left upper eyelid: Secondary | ICD-10-CM | POA: Diagnosis not present

## 2021-03-27 DIAGNOSIS — G5601 Carpal tunnel syndrome, right upper limb: Secondary | ICD-10-CM | POA: Diagnosis not present

## 2021-03-27 DIAGNOSIS — E559 Vitamin D deficiency, unspecified: Secondary | ICD-10-CM

## 2021-03-27 DIAGNOSIS — E538 Deficiency of other specified B group vitamins: Secondary | ICD-10-CM

## 2021-03-27 NOTE — Progress Notes (Signed)
[facility] ?[address] ?[place], [place] [address] ? ?Internal MEDICINE  ?Office Visit Note ? ?Patient Name: Tammy Russo ? [id number]  ?[id number] ? ?Date of Service: 03/27/2021 ? ?Chief Complaint  ?Patient presents with  ? Eye Problem  ?  Left eyelid has a small knot - noticed it a few months ago, increases/decreases in size  ? Hand Problem  ?  Right hand feels stiffness/sore, less mobility in thumb  ? ? ? ?HPI ?Tammy Russo presents for an acute sick visit for knot on left eyelid. Fluctuates in size, not painful, nontender. Tried warm compresses and tried ice. Nothing makes it go away.  This has been present for 2 to 3 months. ?Patient also has right hand stiffness and soreness and tenderness of the right wrist with decreased range of motion in the right thumb.  Patient also reports numbness and tingling in fingers when she is trying to do everyday activities such as typing or using her phone. ?Patient is due for annual physical and repeat Pap in August as well as routine labs.  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  She had biopsy done then.  And she was told she would need another Pap in 2 years which will be in August this year. ? ? ? ? ?Current Medication: ? ?Outpatient Encounter Medications as of 03/27/2021  ?Medication Sig  ? albuterol (VENTOLIN HFA) 108 (90 Base) MCG/ACT inhaler Inhale 2 puffs into the lungs every 6 (six) hours as needed for wheezing or shortness of breath.  ? Fe Fum-FA-B Cmp-C-Zn-Mg-Mn-Cu (HEMOCYTE PLUS) 106-1 MG CAPS Take 1 capsule by mouth daily.  ? levonorgestrel (MIRENA) 20 MCG/24HR IUD 1 each by Intrauterine route once.  ? nystatin (MYCOSTATIN/NYSTOP) powder Apply 1 application topically 3 (three) times daily.  ? [DISCONTINUED] azithromycin (ZITHROMAX) 250 MG tablet Take one tab a day for 10 days for uri (Patient not taking: Reported on 03/27/2021)  ? ?No facility-administered encounter medications on file as of 03/27/2021.  ? ? ? ? ?Medical  History: ?History reviewed. No pertinent past medical history. ? ? ?Vital Signs: ?BP 126/90   Pulse 80   Temp (!) 97.5 ?F (36.4 ?C)   Resp 16   Ht '5\' 5"'  (1.651 m)   Wt 165 lb 6.4 oz (75 kg)   SpO2 99%   BMI 27.52 kg/m?  ? ? ?Review of Systems  ?Constitutional:  Negative for chills, fatigue and unexpected weight change.  ?HENT:  Negative for congestion, rhinorrhea, sneezing and sore throat.   ?Eyes:  Negative for redness.  ?     Bump on left eyelid  ?Respiratory:  Negative for cough, chest tightness and shortness of breath.   ?Cardiovascular:  Negative for chest pain and palpitations.  ?Gastrointestinal:  Negative for abdominal pain, constipation, diarrhea, nausea and vomiting.  ?Genitourinary:  Negative for dysuria and frequency.  ?Musculoskeletal:  Positive for arthralgias (right wrist and thumb). Negative for back pain, joint swelling and neck pain.  ?Skin:  Negative for rash.  ?Neurological: Negative.  Negative for tremors and numbness.  ?Hematological:  Negative for adenopathy. Does not bruise/bleed easily.  ?Psychiatric/Behavioral:  Negative for behavioral problems (Depression), sleep disturbance and suicidal ideas. The patient is not nervous/anxious.   ? ?Physical Exam ?Vitals reviewed.  ?Constitutional:   ?   General: She is not in acute distress. ?   Appearance: Normal appearance. She is not ill-appearing.  ?HENT:  ?   Head: Normocephalic and atraumatic.  ?Eyes:  ?  Pupils: Pupils are equal, round, and reactive to light.  ?Cardiovascular:  ?   Rate and Rhythm: Normal rate and regular rhythm.  ?Pulmonary:  ?   Effort: Pulmonary effort is normal. No respiratory distress.  ?Musculoskeletal:  ?   Comments: Decreased ROM right thumb, slight swelling of right wrist  ?Neurological:  ?   Mental Status: She is alert and oriented to person, place, and time.  ?Psychiatric:     ?   Mood and Affect: Mood normal.     ?   Behavior: Behavior normal.  ? ? ? ? ?Assessment/Plan: ?1. Chalazion of left upper  eyelid ?Refer to ophthalmology. Patient to continue alternating warm and cold compresses as needed and can cancel appt if the problem resolves prior to her seeing ophthalmology. ?- Ambulatory referral to Ophthalmology ? ?2. Right carpal tunnel syndrome ?Refer to Dr. Peggye Ley, hand specialist at [facility].  ?- Ambulatory referral to Orthopedic Surgery ? ?3. Other iron deficiency anemia ?Routine labs ordered ?- CBC with Differential/Platelet ?- CMP14+EGFR ?- TSH + free T4 ?- B12 and Folate Panel ? ?4. Mixed hyperlipidemia ?Routine labs order ?- CMP14+EGFR ?- Lipid Profile ?- TSH + free T4 ? ?5. Vitamin D deficiency ?Routine labs ordered ?- CMP14+EGFR ?- TSH + free T4 ?- Vitamin D (25 hydroxy) ? ?6. B12 deficiency ?Routine labs ordered, patient will have them done prior to her annual physical exam in august.  ?- CMP14+EGFR ?- B12 and Folate Panel ? ? ?General Counseling: Tammy Russo verbalizes understanding of the findings of todays visit and agrees with plan of treatment. I have discussed any further diagnostic evaluation that may be needed or ordered today. We also reviewed her medications today. she has been encouraged to call the office with any questions or concerns that should arise related to todays visit. ? ? ? ?Counseling: ? ? ? ?Orders Placed This Encounter  ?Procedures  ? CBC with Differential/Platelet  ? CMP14+EGFR  ? Lipid Profile  ? TSH + free T4  ? Vitamin D (25 hydroxy)  ? B12 and Folate Panel  ? Ambulatory referral to Ophthalmology  ? Ambulatory referral to Orthopedic Surgery  ? ? ?No orders of the defined types were placed in this encounter. ? ? ?Return for  CPE/PAP, [place] PCP in august this year. . ? ?[place] Controlled Substance Database was reviewed by me for overdose risk score (ORS) ? ?Time spent: 30 minutes ?Time spent with patient included reviewing progress notes, labs, imaging studies, and discussing plan for follow up.  ? ?This patient was seen by Jonetta Osgood, FNP-C in collaboration with Dr.  Clayborn Bigness as a part of collaborative care agreement. ? ?Bj Morlock R. Valetta Fuller, MSN, FNP-C ?Internal Medicine ?

## 2021-03-27 NOTE — Telephone Encounter (Signed)
Ophthalmology referral sent via proficient to [facility]. Ortho surgery referral sent via proficient to [facility] ?

## 2021-04-07 ENCOUNTER — Telehealth: Payer: Self-pay

## 2021-04-07 NOTE — Telephone Encounter (Signed)
[facility] closed referral request due to patient not responding to voicemails or text-[facility] ?

## 2021-05-18 ENCOUNTER — Telehealth: Payer: Self-pay

## 2021-05-18 NOTE — Telephone Encounter (Signed)
Ophthalmology appointment>> 08/09/21 w/ [facility] ? ? ? ? ?

## 2021-05-23 ENCOUNTER — Ambulatory Visit: Payer: Medicaid Other | Admitting: Nurse Practitioner

## 2021-05-25 ENCOUNTER — Encounter: Payer: Self-pay | Admitting: Nurse Practitioner

## 2021-05-25 ENCOUNTER — Ambulatory Visit: Payer: Medicaid Other | Admitting: Nurse Practitioner

## 2021-05-25 VITALS — BP 120/74 | HR 114 | Temp 98.4°F | Resp 16 | Ht 65.0 in | Wt 147.8 lb

## 2021-05-25 DIAGNOSIS — R634 Abnormal weight loss: Secondary | ICD-10-CM

## 2021-05-25 DIAGNOSIS — N926 Irregular menstruation, unspecified: Secondary | ICD-10-CM | POA: Diagnosis not present

## 2021-05-25 DIAGNOSIS — R0602 Shortness of breath: Secondary | ICD-10-CM | POA: Diagnosis not present

## 2021-05-25 DIAGNOSIS — R5383 Other fatigue: Secondary | ICD-10-CM | POA: Diagnosis not present

## 2021-05-25 NOTE — Progress Notes (Signed)
[facility] [place],  [address]  Internal MEDICINE  Office Visit Note  Patient Name: Tammy Russo  [id number]  [id number]  Date of Service: 05/25/2021  Chief Complaint  Patient presents with   Acute Visit    Lost 20lbs, no appetite    Fatigue   Shortness of Breath     HPI Tammy Russo presents for an acute sick visit for significant unintentional weight loss of approximately 38 pounds since January this year.  Her current weight is 147 pounds and she weighed approximately 185 pounds on her home scale in January.  Other associated symptoms reported by the patient today include activity intolerance, shortness of breath with minimal exertion, lack of appetite x1 to 2 months, fatigue, elevated heart rate, and palpitations.  She denies any hair loss, chest pain, abdominal pain, nausea or vomiting, joint pains, headaches.  She reports that she has soft brown stools that are regular but admits she had 1 day of watery diarrhea. She reports that, other than the decreased appetite over the past 1 to 2 months, she has had no significant change in her daily activities or her typical diet and has otherwise been eating regular meals. She has no significant medical history and no chronic medical conditions.  She does have a history of abnormal Pap smear and had a conization with biopsy done previously.  She currently has a Mirena IUD and denies any significant menorrhagia or change in her menstrual cycle.  She has had iron deficiency anemia in the past and was on a Hemocyte plus supplement. She does not have any significant family history of chronic medical conditions that she is aware of. Her vital signs are within normal limits except for elevated pulse of 114. She also admits having difficulty sleeping and this may be associated with some of her other present symptoms.   Current Medication:  Outpatient Encounter Medications as of 05/25/2021  Medication Sig   albuterol  (VENTOLIN HFA) 108 (90 Base) MCG/ACT inhaler Inhale 2 puffs into the lungs every 6 (six) hours as needed for wheezing or shortness of breath.   levonorgestrel (MIRENA) 20 MCG/24HR IUD 1 each by Intrauterine route once.   nystatin (MYCOSTATIN/NYSTOP) powder Apply 1 application topically 3 (three) times daily.   [DISCONTINUED] Fe Fum-FA-B Cmp-C-Zn-Mg-Mn-Cu (HEMOCYTE PLUS) 106-1 MG CAPS Take 1 capsule by mouth daily.   No facility-administered encounter medications on file as of 05/25/2021.      Medical History: Past Medical History:  Diagnosis Date   Anemia    HGSIL (high grade squamous intraepithelial lesion) on Pap smear of cervix    conization w/bx was done previously     Vital Signs: BP 120/74   Pulse (!) 114   Temp 98.4 F (36.9 C)   Resp 16   Ht _0  (1.651 m)   Wt 147 lb 12.8 oz (67 kg)   SpO2 99%   BMI 24.60 kg/m    Review of Systems  Constitutional:  Positive for activity change, appetite change, fatigue and unexpected weight change.  HENT:  Negative for hearing loss, postnasal drip, sore throat, tinnitus and trouble swallowing.        Denies hair loss  Eyes: Negative.   Respiratory:  Positive for shortness of breath. Negative for cough, chest tightness and wheezing.   Cardiovascular:  Positive for palpitations. Negative for chest pain and leg swelling.  Gastrointestinal:  Positive for diarrhea (x1 day). Negative for abdominal pain, constipation, nausea and vomiting.  Endocrine: Negative.  Negative for cold intolerance, heat intolerance, polydipsia, polyphagia and polyuria.  Genitourinary: Negative.  Negative for frequency, hematuria, menstrual problem, pelvic pain, urgency, vaginal bleeding, vaginal discharge and vaginal pain.  Musculoskeletal: Negative.  Negative for back pain, myalgias and neck stiffness.  Skin: Negative.  Negative for rash.  Allergic/Immunologic: Negative for immunocompromised state.  Neurological: Negative.  Negative for dizziness, syncope,  weakness, light-headedness and headaches.  Hematological: Negative.  Negative for adenopathy. Does not bruise/bleed easily.  Psychiatric/Behavioral:  Positive for sleep disturbance. Negative for behavioral problems, confusion, self-injury and suicidal ideas. The patient is not nervous/anxious.    Physical Exam Vitals reviewed.  Constitutional:      General: She is not in acute distress.    Appearance: Normal appearance. She is well-developed and normal weight. She is not ill-appearing.  HENT:     Head: Normocephalic and atraumatic.  Eyes:     Pupils: Pupils are equal, round, and reactive to light.  Neck:     Thyroid: No thyromegaly.  Cardiovascular:     Rate and Rhythm: Regular rhythm. Tachycardia present.     Heart sounds: Normal heart sounds. No murmur heard. Pulmonary:     Effort: Pulmonary effort is normal. No respiratory distress.     Breath sounds: Normal breath sounds.  Abdominal:     General: Bowel sounds are normal.     Palpations: There is no hepatomegaly, splenomegaly or mass.     Tenderness: There is no abdominal tenderness. There is no guarding or rebound.  Skin:    Capillary Refill: Capillary refill takes less than 2 seconds.  Neurological:     Mental Status: She is alert and oriented to person, place, and time.     Cranial Nerves: No cranial nerve deficit.     Motor: No weakness.     Coordination: Coordination normal.     Gait: Gait normal.  Psychiatric:        Mood and Affect: Mood is anxious (worried about symptoms, expected in this current situation).        Behavior: Behavior normal.        Thought Content: Thought content normal.        Judgment: Judgment normal.      Assessment/Plan: 1. Unintentional weight loss Significant amount of weight loss noted since January of this year.  Patient has routine labs that she still has to get drawn and additional labs have been ordered as seen below.  Unintentional weight loss is a symptom of multiple medical  conditions and so multiple conditions should be considered and ruled out appropriately.  Imaging and tests ordered to rule out possible tumor or mass that could be cancer including blood test for tumor markers CEA and CA 125 as well as a chest x-ray to rule out pulmonary nodules.  Other conditions to consider and rule out that can be associated with unintentional weight loss include hepatitis B, hepatitis C, HIV, diabetes, autoimmune disorders, vitamin deficiencies, anemia, and hyperthyroidism.  The most likely differential diagnosis would be hyperthyroidism, vitamin deficiencies and/or anemia as well as various autoimmune disorders.  Although unlikely, other conditions must be ruled out including various forms of cancer, infectious diseases such as hepatitis B, hepatitis C. and HIV and diabetes.  Labs and imaging will be reviewed with the patient at a follow-up visit in a couple of weeks. --- Prior labs that were ordered that the patient still has to have drawn with the additional labs as listed below include CBC, CMP, lipid profile, TSH and free  T4, vitamin D, B12 and folate. - HepB+HepC+HIV Panel - Sed Rate (ESR) - C-reactive protein - Hgb A1C w/o eAG - DG Chest 2 View; Future - CEA - CA 125 - FSH/LH - Estradiol  2. Shortness of breath on exertion Patient has been having shortness of breath with minimal exertion so a chest x-ray will be done to rule out cardiomegaly, asthma and other respiratory disorders.  Sed rate has also been ordered.  After initial testing labs and imaging have been done, depending on the results, an echocardiogram should be considered because shortness of breath on exertion is one of the initial symptoms of heart failure.  This diagnosis is not likely and will only be pursued if no other answers are found with initial testing and imaging. - Sed Rate (ESR) - DG Chest 2 View; Future  3. Activity intolerance related to fatigue Activity intolerance is seen with many different  conditions.  As mentioned in problem #1 and 2 several different conditions are being considered and testing, labs and imaging have been ordered to rule out multiple medical problems. - HepB+HepC+HIV Panel - Sed Rate (ESR) - Hgb A1C w/o eAG - DG Chest 2 View; Future  4. Abnormal menstrual cycle Abnormalities related to sex hormones can cause some of the symptoms that the patient has been experiencing, so these labs have been ordered to further investigate and determine if there is any correlation. - FSH/LH - Estradiol    General Counseling: Lillie verbalizes understanding of the findings of todays visit and agrees with plan of treatment. I have discussed any further diagnostic evaluation that may be needed or ordered today. We also reviewed her medications today. she has been encouraged to call the office with any questions or concerns that should arise related to todays visit.    Counseling:    Orders Placed This Encounter  Procedures   DG Chest 2 View   HepB+HepC+HIV Panel   Sed Rate (ESR)   C-reactive protein   Hgb A1C w/o eAG   CEA   CA 125   FSH/LH   Estradiol    No orders of the defined types were placed in this encounter.   Return in about 2 weeks (around 06/08/2021) for F/U, Review labs/test, Hendel Gatliff PCP.  [place] Controlled Substance Database was reviewed by me for overdose risk score (ORS)  Time spent:30 Minutes Time spent with patient included reviewing progress notes, labs, imaging studies, and discussing plan for follow up.   This patient was seen by Jonetta Osgood, FNP-C in collaboration with Dr. Clayborn Bigness as a part of collaborative care agreement.  Aayra Hornbaker R. Valetta Fuller, MSN, FNP-C Internal Medicine

## 2021-06-01 ENCOUNTER — Ambulatory Visit
Admission: RE | Admit: 2021-06-01 | Discharge: 2021-06-01 | Disposition: A | Discharge status: Home / Self Care | Payer: Medicaid Other | Source: Ambulatory Visit | Attending: Nurse Practitioner | Admitting: Nurse Practitioner

## 2021-06-01 DIAGNOSIS — R634 Abnormal weight loss: Secondary | ICD-10-CM

## 2021-06-01 DIAGNOSIS — R5383 Other fatigue: Secondary | ICD-10-CM | POA: Insufficient documentation

## 2021-06-01 DIAGNOSIS — R0602 Shortness of breath: Secondary | ICD-10-CM | POA: Diagnosis present

## 2021-06-07 NOTE — Progress Notes (Signed)
Chest xray was normal, patient has upcoming appt this week, will discuss further in person at her next office visit.

## 2021-06-08 ENCOUNTER — Encounter: Payer: Self-pay | Admitting: Nurse Practitioner

## 2021-06-08 ENCOUNTER — Ambulatory Visit: Payer: Medicaid Other | Admitting: Nurse Practitioner

## 2021-06-08 VITALS — BP 131/78 | HR 105 | Temp 98.5°F | Resp 16 | Ht 65.0 in | Wt 144.4 lb

## 2021-06-08 DIAGNOSIS — Z23 Encounter for immunization: Secondary | ICD-10-CM

## 2021-06-08 DIAGNOSIS — R634 Abnormal weight loss: Secondary | ICD-10-CM | POA: Diagnosis not present

## 2021-06-08 DIAGNOSIS — F064 Anxiety disorder due to known physiological condition: Secondary | ICD-10-CM

## 2021-06-08 MED ORDER — TETANUS-DIPHTH-ACELL PERTUSSIS 5-2.5-18.5 LF-MCG/0.5 IM SUSP: 0.5000 mL | Freq: Once | INTRAMUSCULAR | 0 refills | Status: AC

## 2021-06-08 NOTE — Progress Notes (Signed)
[facility] [place], [place] [address]  Internal MEDICINE  Office Visit Note  Patient Name: Tammy Russo  [id number]  [id number]  Date of Service: 06/20/2021  Chief Complaint  Patient presents with   Follow-up    Left foot was swollen last night, no pain or injury    Results    HPI Nykayla presents for follow-up visit during which her lab results were going to be discussed today.  She did not go get her labs and initially stated that she forgot but after further discussion with the patient she admits that she was very anxious and worried that the lab results were going to show that she had some sort of cancer.  She was tearful and upset.  I reassured her several times during today's office visit that while cancer does need to be ruled out if other causes are not identified that is not one of the differential diagnoses that are being highly considered.  I informed her that diagnosis or cause of her symptoms will most likely be a thyroid problem, vitamin deficiency and or anemia.  There are even some other causes other than cancer that are more likely than cancer. --Chest x-ray was normal and negative for any abnormalities that would explain her symptoms.      Current Medication: Outpatient Encounter Medications as of 06/08/2021  Medication Sig   albuterol (VENTOLIN HFA) 108 (90 Base) MCG/ACT inhaler Inhale 2 puffs into the lungs every 6 (six) hours as needed for wheezing or shortness of breath.   levonorgestrel (MIRENA) 20 MCG/24HR IUD 1 each by Intrauterine route once.   nystatin (MYCOSTATIN/NYSTOP) powder Apply 1 application topically 3 (three) times daily.   [DISCONTINUED] Fe Fum-FA-B Cmp-C-Zn-Mg-Mn-Cu (HEMOCYTE PLUS) 106-1 MG CAPS Take 1 capsule by mouth daily.   [DISCONTINUED] Tdap (BOOSTRIX) 5-2.5-18.5 LF-MCG/0.5 injection Inject 0.5 mLs into the muscle once.   [EXPIRED] Tdap (BOOSTRIX) 5-2.5-18.5 LF-MCG/0.5 injection Inject 0.5 mLs into the muscle  once for 1 dose.   No facility-administered encounter medications on file as of 06/08/2021.    Surgical History: Past Surgical History:  Procedure Laterality Date   CERVICAL CONIZATION W/BX N/A 09/03/2019   Procedure: CONIZATION CERVIX WITH BIOPSY;  Surgeon: [name], MD;  Location: [facility] ORS;  Service: Gynecology;  Laterality: N/A;   INTRAUTERINE DEVICE (IUD) INSERTION  2014   Mirena    Medical History: Past Medical History:  Diagnosis Date   Anemia    HGSIL (high grade squamous intraepithelial lesion) on Pap smear of cervix    conization w/bx was done previously    Family History: Family History  Adopted: Yes    Social History   Socioeconomic History   Marital status: Single    Spouse name: Not on file   Number of children: Not on file   Years of education: Not on file   Highest education level: Not on file  Occupational History   Not on file  Tobacco Use   Smoking status: Former    Packs/day: 0.50    Types: Cigarettes, E-cigarettes    Quit date: 05/26/2021    Years since quitting: 0.0   Smokeless tobacco: Never   Tobacco comments:    5 Cigarettes daily  Vaping Use   Vaping Use: Never used  Substance and Sexual Activity   Alcohol use: Yes    Comment: rare   Drug use: Yes    Types: Marijuana   Sexual activity: Yes    Birth control/protection: I.U.D.  Other Topics  Concern   Not on file  Social History Narrative   Not on file   Social Determinants of Health   Financial Resource Strain: Not on file  Food Insecurity: Not on file  Transportation Needs: Not on file  Physical Activity: Not on file  Stress: Not on file  Social Connections: Not on file  Intimate Partner Violence: Not on file      Review of Systems  Constitutional:  Positive for activity change, appetite change, fatigue and unexpected weight change.  HENT:  Negative for hearing loss, postnasal drip, sore throat, tinnitus and trouble swallowing.        Denies hair loss  Eyes:  Negative.   Respiratory:  Positive for shortness of breath. Negative for cough, chest tightness and wheezing.   Cardiovascular:  Positive for palpitations. Negative for chest pain and leg swelling.  Gastrointestinal:  Positive for diarrhea (x1 day). Negative for abdominal pain, constipation, nausea and vomiting.  Endocrine: Negative.  Negative for cold intolerance, heat intolerance, polydipsia, polyphagia and polyuria.  Genitourinary: Negative.  Negative for frequency, hematuria, menstrual problem, pelvic pain, urgency, vaginal bleeding, vaginal discharge and vaginal pain.  Musculoskeletal: Negative.  Negative for back pain, myalgias and neck stiffness.  Skin: Negative.  Negative for rash.  Allergic/Immunologic: Negative for immunocompromised state.  Neurological: Negative.  Negative for dizziness, syncope, weakness, light-headedness and headaches.  Hematological: Negative.  Negative for adenopathy. Does not bruise/bleed easily.  Psychiatric/Behavioral:  Positive for sleep disturbance. Negative for behavioral problems, confusion, self-injury and suicidal ideas. The patient is not nervous/anxious.    Vital Signs: BP 131/78   Pulse (!) 105   Temp 98.5 F (36.9 C)   Resp 16   Ht '5\' 5"'$  (1.651 m)   Wt 144 lb 6.4 oz (65.5 kg)   SpO2 99%   BMI 24.03 kg/m    Physical Exam Vitals reviewed.  Constitutional:      General: She is not in acute distress.    Appearance: Normal appearance. She is well-developed and normal weight. She is not ill-appearing.  HENT:     Head: Normocephalic and atraumatic.  Eyes:     Pupils: Pupils are equal, round, and reactive to light.  Neck:     Thyroid: No thyromegaly.  Cardiovascular:     Rate and Rhythm: Regular rhythm. Tachycardia present.     Heart sounds: Normal heart sounds. No murmur heard. Pulmonary:     Effort: Pulmonary effort is normal. No respiratory distress.     Breath sounds: Normal breath sounds.  Abdominal:     General: Bowel sounds are  normal.     Palpations: There is no hepatomegaly, splenomegaly or mass.     Tenderness: There is no abdominal tenderness. There is no guarding or rebound.  Skin:    Capillary Refill: Capillary refill takes less than 2 seconds.  Neurological:     Mental Status: She is alert and oriented to person, place, and time.     Cranial Nerves: No cranial nerve deficit.     Motor: No weakness.     Coordination: Coordination normal.     Gait: Gait normal.  Psychiatric:        Mood and Affect: Mood is anxious (worried about symptoms, expected in this current situation). Affect is tearful.        Behavior: Behavior normal. Behavior is cooperative.        Thought Content: Thought content normal.        Judgment: Judgment normal.  Assessment/Plan: 1. Unintentional weight loss Patient instructed to get all of her labs drawn.  Informed patient that after all of the labs have resulted, I will review them and then we will talk over the phone or video visit to discuss the lab results.  If everything is normal on her labs, she will receive a call from nursing staff instead.  Patient states she will not look at her labs on MyChart prior to discussing the results with me.  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 Need for vaccination - Tdap ([place]) 5-2.5-18.5 LF-MCG/0.5 injection; Inject 0.5 mLs into the muscle once for 1 dose.  Dispense: 0.5 mL; Refill: 0   General Counseling: Analeia verbalizes understanding of the findings of todays visit and agrees with plan of treatment. I have discussed any further diagnostic evaluation that may be needed or ordered today. We also reviewed her medications today. she has been encouraged to call the office with any  questions or concerns that should arise related to todays visit.    No orders of the defined types were placed in this encounter.   Meds ordered this encounter  Medications   Tdap (BOOSTRIX) 5-2.5-18.5 LF-MCG/0.5 injection    Sig: Inject 0.5 mLs into the muscle once for 1 dose.    Dispense:  0.5 mL    Refill:  0    Return for F/U, Labs telehealth visit to discuss. .   Total time spent:20 Minutes Time spent includes review of chart, medications, test results, and follow up plan with the patient.   [place] Controlled Substance Database was reviewed by me.  This patient was seen by Jonetta Osgood, FNP-C in collaboration with Dr. Clayborn Bigness as a part of collaborative care agreement.   Dorcas Melito R. Valetta Fuller, MSN, FNP-C Internal medicine

## 2021-06-14 LAB — CBC WITH DIFFERENTIAL/PLATELET
Basophils Absolute: 0 10*3/uL (ref 0.0–0.2)
Basos: 0 %
EOS (ABSOLUTE): 0.2 10*3/uL (ref 0.0–0.4)
Eos: 3 %
Hematocrit: 27.2 % — ABNORMAL LOW (ref 34.0–46.6)
Hemoglobin: 8 g/dL — ABNORMAL LOW (ref 11.1–15.9)
Immature Grans (Abs): 0 10*3/uL (ref 0.0–0.1)
Immature Granulocytes: 0 %
Lymphocytes Absolute: 2.6 10*3/uL (ref 0.7–3.1)
Lymphs: 47 %
MCH: 18.7 pg — ABNORMAL LOW (ref 26.6–33.0)
MCHC: 29.4 g/dL — ABNORMAL LOW (ref 31.5–35.7)
MCV: 64 fL — ABNORMAL LOW (ref 79–97)
Monocytes Absolute: 0.6 10*3/uL (ref 0.1–0.9)
Monocytes: 11 %
Neutrophils Absolute: 2.2 10*3/uL (ref 1.4–7.0)
Neutrophils: 39 %
Platelets: 303 10*3/uL (ref 150–450)
RBC: 4.28 x10E6/uL (ref 3.77–5.28)
RDW: 20.1 % — ABNORMAL HIGH (ref 11.7–15.4)
WBC: 5.7 10*3/uL (ref 3.4–10.8)

## 2021-06-14 LAB — CMP14+EGFR
ALT: 32 IU/L (ref 0–32)
AST: 36 IU/L (ref 0–40)
Albumin/Globulin Ratio: 1.5 (ref 1.2–2.2)
Albumin: 3.8 g/dL (ref 3.8–4.8)
Alkaline Phosphatase: 75 IU/L (ref 44–121)
BUN/Creatinine Ratio: 20 (ref 9–23)
BUN: 8 mg/dL (ref 6–20)
Bilirubin Total: 0.3 mg/dL (ref 0.0–1.2)
CO2: 19 mmol/L — ABNORMAL LOW (ref 20–29)
Calcium: 9.2 mg/dL (ref 8.7–10.2)
Chloride: 107 mmol/L — ABNORMAL HIGH (ref 96–106)
Creatinine, Ser: 0.4 mg/dL — ABNORMAL LOW (ref 0.57–1.00)
Globulin, Total: 2.6 g/dL (ref 1.5–4.5)
Glucose: 90 mg/dL (ref 70–99)
Potassium: 3.9 mmol/L (ref 3.5–5.2)
Sodium: 140 mmol/L (ref 134–144)
Total Protein: 6.4 g/dL (ref 6.0–8.5)
eGFR: 131 mL/min/{1.73_m2} (ref >59)

## 2021-06-14 LAB — LIPID PANEL
Chol/HDL Ratio: 2.4 ratio (ref 0.0–4.4)
Cholesterol, Total: 79 mg/dL — ABNORMAL LOW (ref 100–199)
HDL: 33 mg/dL — ABNORMAL LOW (ref >39)
LDL Chol Calc (NIH): 32 mg/dL (ref 0–99)
Triglycerides: 55 mg/dL (ref 0–149)
VLDL Cholesterol Cal: 14 mg/dL (ref 5–40)

## 2021-06-14 LAB — HEPB+HEPC+HIV PANEL
HIV Screen 4th Generation wRfx: NONREACTIVE
Hep B C IgM: NEGATIVE
Hep B Core Total Ab: NEGATIVE
Hep B E Ab: NEGATIVE
Hep B E Ag: NEGATIVE
Hep B Surface Ab, Qual: REACTIVE
Hep C Virus Ab: NONREACTIVE
Hepatitis B Surface Ag: NEGATIVE

## 2021-06-14 LAB — CEA: CEA: 0.9 ng/mL (ref 0.0–4.7)

## 2021-06-14 LAB — CA 125: Cancer Antigen (CA) 125: 15.3 U/mL (ref 0.0–38.1)

## 2021-06-14 LAB — VITAMIN D 25 HYDROXY (VIT D DEFICIENCY, FRACTURES): Vit D, 25-Hydroxy: 25.8 ng/mL — ABNORMAL LOW (ref 30.0–100.0)

## 2021-06-14 LAB — TSH+FREE T4
Free T4: 5.48 ng/dL — ABNORMAL HIGH (ref 0.82–1.77)
TSH: <0.005 u[IU]/mL — ABNORMAL LOW (ref 0.450–4.500)

## 2021-06-14 LAB — B12 AND FOLATE PANEL
Folate: 12.7 ng/mL (ref >3.0)
Vitamin B-12: 615 pg/mL (ref 232–1245)

## 2021-06-14 LAB — ESTRADIOL: Estradiol: 126 pg/mL

## 2021-06-14 LAB — FSH/LH
FSH: 4.1 m[IU]/mL
LH: 3.5 m[IU]/mL

## 2021-06-14 LAB — SEDIMENTATION RATE: Sed Rate: 33 mm/hr — ABNORMAL HIGH (ref 0–32)

## 2021-06-14 LAB — HGB A1C W/O EAG: Hgb A1c MFr Bld: 5.3 % (ref 4.8–5.6)

## 2021-06-14 LAB — C-REACTIVE PROTEIN: CRP: <1 mg/L (ref 0–10)

## 2021-06-15 ENCOUNTER — Telehealth: Payer: Medicaid Other | Admitting: Nurse Practitioner

## 2021-06-15 ENCOUNTER — Telehealth: Payer: Self-pay

## 2021-06-15 ENCOUNTER — Encounter: Payer: Self-pay | Admitting: Nurse Practitioner

## 2021-06-15 VITALS — Resp 16 | Ht 65.0 in | Wt 142.0 lb

## 2021-06-15 DIAGNOSIS — E559 Vitamin D deficiency, unspecified: Secondary | ICD-10-CM

## 2021-06-15 DIAGNOSIS — E0590 Thyrotoxicosis, unspecified without thyrotoxic crisis or storm: Secondary | ICD-10-CM

## 2021-06-15 DIAGNOSIS — N926 Irregular menstruation, unspecified: Secondary | ICD-10-CM

## 2021-06-15 DIAGNOSIS — D508 Other iron deficiency anemias: Secondary | ICD-10-CM

## 2021-06-15 DIAGNOSIS — R5383 Other fatigue: Secondary | ICD-10-CM

## 2021-06-15 DIAGNOSIS — R634 Abnormal weight loss: Secondary | ICD-10-CM | POA: Diagnosis not present

## 2021-06-15 NOTE — Progress Notes (Signed)
[facility] [place], [place] [address]  Internal MEDICINE  Telephone Visit  Patient Name: Tammy Russo  [id number]  [id number]  Date of Service: 06/15/2021  I connected with the patient at 4:45 PM by telephone and verified the patients identity using two identifiers.   I discussed the limitations, risks, security and privacy concerns of performing an evaluation and management service by telephone and the availability of in person appointments. I also discussed with the patient that there may be a patient responsible charge related to the service.  The patient expressed understanding and agrees to proceed.    Chief Complaint  Patient presents with  . Acute Visit  . Telephone Assessment    video  . Telephone Screen    [phone]  . Results    HPI Tammy Russo presents for a telehealth virtual visit to discuss lab results.  Patient was recently seen for significant unintentional weight loss, fatigue, palpitations, tachycardia, decreased appetite, shortness of breath, activity intolerance.  Patient has lost approximately 38 pounds since January this year without any intentional modifications to diet or lifestyle.  With the only significant change being that she had a decreased appetite for the past 1 to 2 months but the weight loss started a few months before then. She has not experienced any hair loss or joint pains.  She denies any chest pain, abdominal pain significant changes in bowel pattern, nausea or vomiting.  Labs were initially ordered but patient did not have the labs done because she was worried that she could have cancer and after discussing her apprehensions and anxiety related to the labs, patient had her labs done and they were discussed today over this virtual visit. Primary significant lab findings indicate severe overt hyperthyroidism and additional labs were already added on to the blood that was already drawn at [place] to help differentiate  between Graves' disease or a secondary cause.   Rashiya was relieved with the findings.  She was glad to have an answer and glad that it was not cancer.  I reviewed the rest of her labs with her as follows: -- Estradiol, FSH and LH were all normal.  Additional labs related to these were also drawn and were negative which is the CA125 and CEA tumor markers. --Her B12 and folate levels were normal --Labs for hepatitis B, hepatitis C, and HIV were all negative/nonreactive except for the appropriate hepatitis B surface antibody which was reactive which indicates immunity related to the hepatitis B vaccine. -- Her hemoglobin A1c was checked to rule out diabetes and it was normal at 5.3 --Her C-reactive protein was normal and her sed rate was slightly elevated at 32 which would be expected with the increased thyroid activity --Her CBC was significantly abnormal.  She has a history of mild anemia over the past couple of years.  Her current CBC showed a more significant degree of anemia with a hemoglobin of 8.0, hematocrit of 27.2, MCV 64, MCH 18.7.  Her RDW was increased at 20.1%.  Platelet level was normal and her white blood cells were not elevated. --Her thyroid-stimulating hormone was severely low at less than 0.005, and her free T4 was more than 3 times above normal range at 5.48.  Additional labs that were added on include a free T3 level, TPO, and celiac panel.  Thyrotropin antibodies and osteocalcin were unable to be added on because they did not have the correct type of specimen in the correct type of tube from what had  already been drawn. --Her cholesterol levels were also abnormal and correlate with the increased thyroid activity.  Her total cholesterol was low at 79, HDL was low at 33 and LDL was 32. --Vitamin D level was also slightly low at 25.8.   Current Medication: Outpatient Encounter Medications as of 06/15/2021  Medication Sig  . albuterol (VENTOLIN HFA) 108 (90 Base) MCG/ACT inhaler  Inhale 2 puffs into the lungs every 6 (six) hours as needed for wheezing or shortness of breath.  . Fe Fum-FA-B Cmp-C-Zn-Mg-Mn-Cu (HEMOCYTE PLUS) 106-1 MG CAPS Take 1 capsule by mouth daily.  Marland Kitchen levonorgestrel (MIRENA) 20 MCG/24HR IUD 1 each by Intrauterine route once.  . nystatin (MYCOSTATIN/NYSTOP) powder Apply 1 application topically 3 (three) times daily.   No facility-administered encounter medications on file as of 06/15/2021.    Surgical History: Past Surgical History:  Procedure Laterality Date  . CERVICAL CONIZATION W/BX N/A 09/03/2019   Procedure: CONIZATION CERVIX WITH BIOPSY;  Surgeon: [name], MD;  Location: [facility] ORS;  Service: Gynecology;  Laterality: N/A;  . INTRAUTERINE DEVICE (IUD) INSERTION  2014   Mirena    Medical History: History reviewed. No pertinent past medical history.  Family History: Family History  Adopted: Yes    Social History   Socioeconomic History  . Marital status: Single    Spouse name: Not on file  . Number of children: Not on file  . Years of education: Not on file  . Highest education level: Not on file  Occupational History  . Not on file  Tobacco Use  . Smoking status: Former    Packs/day: 0.50    Types: Cigarettes, E-cigarettes    Quit date: 05/26/2021    Years since quitting: 0.0  . Smokeless tobacco: Never  . Tobacco comments:    5 Cigarettes daily  Vaping Use  . Vaping Use: Never used  Substance and Sexual Activity  . Alcohol use: Yes    Comment: rare  . Drug use: Yes    Types: Marijuana  . Sexual activity: Yes    Birth control/protection: I.U.D.  Other Topics Concern  . Not on file  Social History Narrative  . Not on file   Social Determinants of Health   Financial Resource Strain: Not on file  Food Insecurity: Not on file  Transportation Needs: Not on file  Physical Activity: Not on file  Stress: Not on file  Social Connections: Not on file  Intimate Partner Violence: Not on file      Review of  Systems  Vital Signs: Resp 16   Ht '5\' 5"'$  (1.651 m)   Wt 142 lb (64.4 kg)   BMI 23.63 kg/m    Observation/Objective:     Assessment/Plan:   General Counseling: Tammy Russo verbalizes understanding of the findings of today's phone visit and agrees with plan of treatment. I have discussed any further diagnostic evaluation that may be needed or ordered today. We also reviewed her medications today. she has been encouraged to call the office with any questions or concerns that should arise related to todays visit.  No follow-ups on file.   No orders of the defined types were placed in this encounter.   No orders of the defined types were placed in this encounter.   Time spent:*** Minutes Time spent with patient included reviewing progress notes, labs, imaging studies, and discussing plan for follow up.  [place] Controlled Substance Database was reviewed by me for overdose risk score (ORS) if appropriate.  This patient was seen by  Jonetta Osgood, FNP-C in collaboration with Dr. Clayborn Bigness as a part of collaborative care agreement.  Lorenso Quirino R. Valetta Fuller, MSN, FNP-C Internal medicine

## 2021-06-19 ENCOUNTER — Encounter: Payer: Self-pay | Admitting: Nurse Practitioner

## 2021-06-20 ENCOUNTER — Encounter: Payer: Self-pay | Admitting: Nurse Practitioner

## 2021-06-20 NOTE — Telephone Encounter (Signed)
Per Alyssa's request called [facility], added free T3, Thyroperidoxase antibody, and celiac panel. Thyrotropin antibodies requires a gel barrier which they did not have a sample of, and osteocalcin requires frozen serum which was not originally pulled. Dx code eo3.0 primary hyperthyroidism. Message sent to [facility] with above info.

## 2021-06-21 ENCOUNTER — Encounter: Payer: Self-pay | Admitting: Nurse Practitioner

## 2021-06-26 ENCOUNTER — Ambulatory Visit (INDEPENDENT_AMBULATORY_CARE_PROVIDER_SITE_OTHER): Payer: Medicaid Other

## 2021-06-26 DIAGNOSIS — E0590 Thyrotoxicosis, unspecified without thyrotoxic crisis or storm: Secondary | ICD-10-CM | POA: Diagnosis not present

## 2021-06-28 ENCOUNTER — Encounter: Payer: Self-pay | Admitting: Nurse Practitioner

## 2021-06-29 LAB — CELIAC DISEASE PANEL
Endomysial IgA: NEGATIVE
IgA/Immunoglobulin A, Serum: 139 mg/dL (ref 87–352)
Transglutaminase IgA: <2 U/mL (ref 0–3)

## 2021-06-29 LAB — OSTEOCALCIN, SERUM: Osteocalcin: 40.3 ng/mL

## 2021-06-29 LAB — IRON,TIBC AND FERRITIN PANEL
Ferritin: 9 ng/mL — ABNORMAL LOW (ref 15–150)
Iron Saturation: 4 % — CL (ref 15–55)
Iron: 14 ug/dL — ABNORMAL LOW (ref 27–159)
Total Iron Binding Capacity: 333 ug/dL (ref 250–450)
UIBC: 319 ug/dL (ref 131–425)

## 2021-06-29 LAB — T3, FREE: T3, Free: 28.1 pg/mL (ref 2.0–4.4)

## 2021-06-29 LAB — T3 UPTAKE: T3 Uptake Ratio: 40 % — ABNORMAL HIGH (ref 24–39)

## 2021-06-29 LAB — THYROGLOBULIN ANTIBODY: Thyroglobulin Antibody: 1.3 IU/mL — ABNORMAL HIGH (ref 0.0–0.9)

## 2021-06-30 DIAGNOSIS — D509 Iron deficiency anemia, unspecified: Secondary | ICD-10-CM | POA: Insufficient documentation

## 2021-07-05 NOTE — Progress Notes (Signed)
Appt for tomorrow to fully discuss labs, treatment and next steps

## 2021-07-06 ENCOUNTER — Encounter: Payer: Self-pay | Admitting: Nurse Practitioner

## 2021-07-06 ENCOUNTER — Ambulatory Visit: Payer: Medicaid Other | Admitting: Nurse Practitioner

## 2021-07-06 ENCOUNTER — Telehealth: Payer: Self-pay

## 2021-07-06 VITALS — BP 122/77 | HR 115 | Temp 97.6°F | Resp 16 | Ht 65.0 in | Wt 138.4 lb

## 2021-07-06 DIAGNOSIS — D500 Iron deficiency anemia secondary to blood loss (chronic): Secondary | ICD-10-CM

## 2021-07-06 DIAGNOSIS — Z23 Encounter for immunization: Secondary | ICD-10-CM

## 2021-07-06 DIAGNOSIS — R000 Tachycardia, unspecified: Secondary | ICD-10-CM | POA: Diagnosis not present

## 2021-07-06 DIAGNOSIS — E041 Nontoxic single thyroid nodule: Secondary | ICD-10-CM

## 2021-07-06 DIAGNOSIS — E0590 Thyrotoxicosis, unspecified without thyrotoxic crisis or storm: Secondary | ICD-10-CM

## 2021-07-06 DIAGNOSIS — F064 Anxiety disorder due to known physiological condition: Secondary | ICD-10-CM

## 2021-07-06 MED ORDER — TETANUS-DIPHTH-ACELL PERTUSSIS 5-2.5-18.5 LF-MCG/0.5 IM SUSP: 0.5000 mL | Freq: Once | INTRAMUSCULAR | 0 refills | Status: AC

## 2021-07-06 MED ORDER — FERROUS FUMARATE 325 (106 FE) MG PO TABS: 1.0000 | ORAL_TABLET | Freq: Two times a day (BID) | ORAL | 2 refills | Status: AC

## 2021-07-06 MED ORDER — PROPRANOLOL HCL 20 MG PO TABS: 20.0000 mg | ORAL_TABLET | Freq: Three times a day (TID) | ORAL | 2 refills | Status: DC

## 2021-07-06 NOTE — Progress Notes (Signed)
[facility] [place],  [address]  Internal MEDICINE  Office Visit Note  Patient Name: Tammy Russo  [id number]  [id number]  Date of Service: 07/06/2021  Chief Complaint  Patient presents with   Follow-up    U/S   Quality Metric Gaps    Tetanus    HPI Candid presents for follow-up visit to review labs and her thyroid ultrasound.  Additional labs were ordered and results discussed with patient today.  Her celiac panel was negative.  Her free T3, T3 uptake and thyroglobulin antibody were all elevated.  TPO was ordered but not drawn.  Osteocalcin is borderline elevated, will consider BMD screening after thyroid levels are normalized with treatment. Thyroid ultrasound: --The patient had a thyroid ultrasound in was results were discussed with her today as well.  Her thyroid ultrasound showed hyperplasia of a right and left lobe of the thyroid, thyromegaly with increased vascularity of both lobes.  She has a solitary nodule in the midportion of the right lobe with mixed density but predominantly isoechoic and measuring 1.7 x 1.5 x 1.4 cm.  This nodule was scored a T RADS 3 and it is below the benchmark for biopsy at this time. --There is a solitary nodule in the left lobe of the thyroid that is well-circumscribed isoechoic with some rim calcification that measures 2.2 x 2.2 x 2.4 cm and is considered suspicious with a score T RADS 4 and ultrasound-guided fine-needle aspiration biopsy is recommended.  -- Other labs that are significant and needs to be addressed: --Ferritin and iron levels are significantly low as well as normal iron saturation.  Patient is anemic with a hemoglobin of 8.0, low hematocrit, MCV and MCH and elevated RDW. Her B12 level is normal, sed rate was slightly elevated and vitamin D is slightly low at 25.  Consistent with a diagnosis of hyperthyroidism for total cholesterol is low at 79 and her HDL is low at 30.      Current  Medication: Outpatient Encounter Medications as of 07/06/2021  Medication Sig   albuterol (VENTOLIN HFA) 108 (90 Base) MCG/ACT inhaler Inhale 2 puffs into the lungs every 6 (six) hours as needed for wheezing or shortness of breath.   ferrous fumarate (HEMOCYTE - 106 MG FE) 325 (106 Fe) MG TABS tablet Take 1 tablet (106 mg of iron total) by mouth 2 (two) times daily.   levonorgestrel (MIRENA) 20 MCG/24HR IUD 1 each by Intrauterine route once.   nystatin (MYCOSTATIN/NYSTOP) powder Apply 1 application topically 3 (three) times daily.   propranolol (INDERAL) 20 MG tablet Take 1 tablet (20 mg total) by mouth 3 (three) times daily.   Tdap (BOOSTRIX) 5-2.5-18.5 LF-MCG/0.5 injection Inject 0.5 mLs into the muscle once for 1 dose.   No facility-administered encounter medications on file as of 07/06/2021.    Surgical History: Past Surgical History:  Procedure Laterality Date   CERVICAL CONIZATION W/BX N/A 09/03/2019   Procedure: CONIZATION CERVIX WITH BIOPSY;  Surgeon: [name], MD;  Location: [facility] ORS;  Service: Gynecology;  Laterality: N/A;   INTRAUTERINE DEVICE (IUD) INSERTION  2014   Mirena    Medical History: Past Medical History:  Diagnosis Date   Anemia    HGSIL (high grade squamous intraepithelial lesion) on Pap smear of cervix    conization w/bx was done previously    Family History: Family History  Adopted: Yes    Social History   Socioeconomic History   Marital status: Single    Spouse name:  Not on file   Number of children: Not on file   Years of education: Not on file   Highest education level: Not on file  Occupational History   Not on file  Tobacco Use   Smoking status: Former    Packs/day: 0.50    Types: Cigarettes, E-cigarettes    Quit date: 05/26/2021    Years since quitting: 0.1   Smokeless tobacco: Never   Tobacco comments:    5 Cigarettes daily  Vaping Use   Vaping Use: Never used  Substance and Sexual Activity   Alcohol use: Yes    Comment:  rare   Drug use: Yes    Types: Marijuana   Sexual activity: Yes    Birth control/protection: I.U.D.  Other Topics Concern   Not on file  Social History Narrative   Not on file   Social Determinants of Health   Financial Resource Strain: Not on file  Food Insecurity: Not on file  Transportation Needs: Not on file  Physical Activity: Not on file  Stress: Not on file  Social Connections: Not on file  Intimate Partner Violence: Not on file      Review of Systems  Constitutional:  Positive for activity change, appetite change, fatigue and unexpected weight change.  HENT:  Negative for hearing loss, postnasal drip, sore throat, tinnitus and trouble swallowing.        Denies hair loss  Eyes: Negative.   Respiratory:  Positive for shortness of breath. Negative for cough, chest tightness and wheezing.   Cardiovascular:  Positive for palpitations. Negative for chest pain and leg swelling.  Gastrointestinal:  Positive for diarrhea (x1 day). Negative for abdominal pain, constipation, nausea and vomiting.  Endocrine: Negative.  Negative for cold intolerance, heat intolerance, polydipsia, polyphagia and polyuria.  Genitourinary: Negative.  Negative for frequency, hematuria, menstrual problem, pelvic pain, urgency, vaginal bleeding, vaginal discharge and vaginal pain.  Musculoskeletal: Negative.  Negative for back pain, myalgias and neck stiffness.  Skin: Negative.  Negative for rash.  Allergic/Immunologic: Negative for immunocompromised state.  Neurological: Negative.  Negative for dizziness, syncope, weakness, light-headedness and headaches.  Hematological: Negative.  Negative for adenopathy. Does not bruise/bleed easily.  Psychiatric/Behavioral:  Positive for sleep disturbance. Negative for behavioral problems, confusion, self-injury and suicidal ideas. The patient is nervous/anxious.     Vital Signs: BP 122/77   Pulse (!) 115   Temp 97.6 F (36.4 C)   Resp 16   Ht '5\' 5"'$  (1.651 m)    Wt 138 lb 6.4 oz (62.8 kg)   LMP 06/26/2021 (Approximate)   SpO2 98%   BMI 23.03 kg/m    Physical Exam Vitals reviewed.  Constitutional:      General: She is not in acute distress.    Appearance: Normal appearance. She is well-developed and normal weight. She is not ill-appearing.  HENT:     Head: Normocephalic and atraumatic.  Eyes:     Pupils: Pupils are equal, round, and reactive to light.  Neck:     Thyroid: No thyromegaly.  Cardiovascular:     Rate and Rhythm: Regular rhythm. Tachycardia present.     Heart sounds: Normal heart sounds. No murmur heard. Pulmonary:     Effort: Pulmonary effort is normal. No respiratory distress.     Breath sounds: Normal breath sounds.  Abdominal:     General: Bowel sounds are normal.     Palpations: There is no hepatomegaly, splenomegaly or mass.     Tenderness: There is no abdominal tenderness.  There is no guarding or rebound.  Skin:    Capillary Refill: Capillary refill takes less than 2 seconds.  Neurological:     Mental Status: She is alert and oriented to person, place, and time.     Cranial Nerves: No cranial nerve deficit.     Motor: No weakness.     Coordination: Coordination normal.     Gait: Gait normal.  Psychiatric:        Mood and Affect: Mood is anxious (worried about symptoms, expected in this current situation). Affect is tearful.        Behavior: Behavior normal. Behavior is cooperative.        Thought Content: Thought content normal.        Judgment: Judgment normal.        Assessment/Plan: 1. Primary hyperthyroidism Hyperthyroidism most likely consistent with Graves' disease, thyroid ultrasound reviewed was done and the results were discussed and a fine-needle aspiration biopsy is recommended of the solitary nodule in the left lobe of the thyroid.  Patient referred to Dr. Tami Ribas at [facility] for urgent biopsy to rule out cancer.  Treatment with antithyroid medication has already been discussed with the  patient and she is agreeable to this start methimazole but a thyroid biopsy must be done prior to starting the medication to rule out thyroid cancer.  Methimazole cannot be administered if thyroid cancer is found.  Propranolol 20 mg 3 times daily has been prescribed to treat symptomatic tachycardia related to hyperthyroidism.  Patient is agreeable to the plan and acknowledges understanding of biopsy and new medication. - propranolol (INDERAL) 20 MG tablet; Take 1 tablet (20 mg total) by mouth 3 (three) times daily.  Dispense: 90 tablet; Refill: 2 - Ambulatory referral to ENT  2. Thyroid nodule greater than or equal to 1.5 cm in diameter incidentally noted on imaging study Thyroid nodule on the left lobe of thyroid measures 2.2 x 2.2 x 2.4 cm and FNA biopsy is recommended, patient referred to Dr. Benjamine Mola at [facility] for biopsy.  Once the biopsy is done, patient will be notified of the results and methimazole will be prescribed after a negative biopsy result. - Ambulatory referral to ENT  3. Iron deficiency anemia due to chronic blood loss Patient has significant iron deficiency anemia but let her levels are not low enough for blood transfusion.  Patient will take ferrous fumarate 325 mg twice daily.  If patient develops constipation, may decrease to once daily.  Will repeat iron panel and CBC after 3 months. - ferrous fumarate (HEMOCYTE - 106 MG FE) 325 (106 Fe) MG TABS tablet; Take 1 tablet (106 mg of iron total) by mouth 2 (two) times daily.  Dispense: 60 tablet; Refill: 2  4. Symptomatic tachycardia As previously mentioned propranolol has been prescribed to treat symptomatic tachycardia related to hypothyroidism, patient is agreeable to plan, we will reassess in a month. - propranolol (INDERAL) 20 MG tablet; Take 1 tablet (20 mg total) by mouth 3 (three) times daily.  Dispense: 90 tablet; Refill: 2  5. Need for vaccination - Tdap ([place]) 5-2.5-18.5 LF-MCG/0.5 injection; Inject 0.5 mLs into  the muscle once for 1 dose.  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  Patient was tearful during office visit today but is understanding of  current pathway of discussing results and then having biopsy done prior to treatment.  Patient did feel relieved when she found out that her medication will be prescribed to improve her iron levels, anemia and to help control her racing heartbeat.   General Counseling: Halena verbalizes understanding of the findings of todays visit and agrees with plan of treatment. I have discussed any further diagnostic evaluation that may be needed or ordered today. We also reviewed her medications today. she has been encouraged to call the office with any questions or concerns that should arise related to todays visit.    Orders Placed This Encounter  Procedures   Ambulatory referral to ENT    Meds ordered this encounter  Medications   propranolol (INDERAL) 20 MG tablet    Sig: Take 1 tablet (20 mg total) by mouth 3 (three) times daily.    Dispense:  90 tablet    Refill:  2   ferrous fumarate (HEMOCYTE - 106 MG FE) 325 (106 Fe) MG TABS tablet    Sig: Take 1 tablet (106 mg of iron total) by mouth 2 (two) times daily.    Dispense:  60 tablet    Refill:  2    If not covered by insurance, please show patient where this medication is in the store so she can purchase it OTC.   Tdap (BOOSTRIX) 5-2.5-18.5 LF-MCG/0.5 injection    Sig: Inject 0.5 mLs into the muscle once for 1 dose.    Dispense:  0.5 mL    Refill:  0    Return in about 1 month (around 08/05/2021) for F/U, Camren Lipsett PCP.   Total time spent:30 Minutes Time spent includes review of chart, medications, test results, and follow up plan with the patient.   [place] Controlled  Substance Database was reviewed by me.  This patient was seen by Jonetta Osgood, FNP-C in collaboration with Dr. Clayborn Bigness as a part of collaborative care agreement.   Paislynn Hegstrom R. Valetta Fuller, MSN, FNP-C Internal medicine

## 2021-07-06 NOTE — Telephone Encounter (Signed)
Referral for ENT placed In [place] folder.

## 2021-07-07 ENCOUNTER — Telehealth: Payer: Self-pay

## 2021-07-07 NOTE — Telephone Encounter (Signed)
ENT referral sent via [facility] to [facility]-Toni 

## 2021-07-10 ENCOUNTER — Other Ambulatory Visit: Payer: Medicaid Other

## 2021-07-11 NOTE — Telephone Encounter (Signed)
08/21/21 @ 1:40 ENT appointment-Tammy Russo

## 2021-07-12 ENCOUNTER — Telehealth: Payer: Self-pay

## 2021-07-12 NOTE — Telephone Encounter (Signed)
ENT appointment moved up to 07/13/21 @ 8:45 with [facility] due to being urgent-Toni

## 2021-07-13 ENCOUNTER — Other Ambulatory Visit: Payer: Self-pay | Admitting: Otolaryngology

## 2021-07-13 DIAGNOSIS — E0590 Thyrotoxicosis, unspecified without thyrotoxic crisis or storm: Secondary | ICD-10-CM

## 2021-07-13 DIAGNOSIS — E041 Nontoxic single thyroid nodule: Secondary | ICD-10-CM

## 2021-07-18 ENCOUNTER — Ambulatory Visit
Admission: RE | Admit: 2021-07-18 | Discharge: 2021-07-18 | Disposition: A | Discharge status: Home / Self Care | Payer: Medicaid Other | Source: Ambulatory Visit | Attending: Otolaryngology | Admitting: Otolaryngology

## 2021-07-18 DIAGNOSIS — E0590 Thyrotoxicosis, unspecified without thyrotoxic crisis or storm: Secondary | ICD-10-CM

## 2021-07-18 DIAGNOSIS — E041 Nontoxic single thyroid nodule: Secondary | ICD-10-CM

## 2021-07-20 ENCOUNTER — Other Ambulatory Visit: Payer: Self-pay | Admitting: Otolaryngology

## 2021-07-20 ENCOUNTER — Ambulatory Visit: Payer: Medicaid Other | Admitting: Nurse Practitioner

## 2021-07-20 DIAGNOSIS — E041 Nontoxic single thyroid nodule: Secondary | ICD-10-CM

## 2021-07-25 ENCOUNTER — Encounter: Payer: Self-pay | Admitting: Nurse Practitioner

## 2021-07-31 ENCOUNTER — Ambulatory Visit
Admission: RE | Admit: 2021-07-31 | Discharge: 2021-07-31 | Disposition: A | Discharge status: Home / Self Care | Payer: Medicaid Other | Source: Ambulatory Visit | Attending: Otolaryngology | Admitting: Otolaryngology

## 2021-07-31 DIAGNOSIS — E041 Nontoxic single thyroid nodule: Secondary | ICD-10-CM | POA: Diagnosis not present

## 2021-07-31 NOTE — Procedures (Signed)
Successful US guided FNA of left inferior thyroid nodule No complications. See PACS for full report.  Tsosie Billing D, PA-C 07/31/2021, 1:40 PM

## 2021-08-03 ENCOUNTER — Encounter: Payer: Self-pay | Admitting: Nurse Practitioner

## 2021-08-03 ENCOUNTER — Other Ambulatory Visit: Payer: Self-pay | Admitting: Nurse Practitioner

## 2021-08-17 ENCOUNTER — Ambulatory Visit: Payer: Medicaid Other | Admitting: Nurse Practitioner

## 2021-08-21 ENCOUNTER — Encounter: Payer: Self-pay | Admitting: Otolaryngology

## 2021-08-21 LAB — CYTOLOGY - NON PAP

## 2021-08-22 ENCOUNTER — Encounter: Payer: Self-pay | Admitting: Nurse Practitioner

## 2021-08-22 DIAGNOSIS — E0500 Thyrotoxicosis with diffuse goiter without thyrotoxic crisis or storm: Secondary | ICD-10-CM

## 2021-08-22 NOTE — Telephone Encounter (Signed)
Called patient regarding additional labs to rule out thyroid cancer and Dr. Richardson [facility] ENT reports that the additional testing was negative and we are safe to start treatment for Grave's disease. Patient prefers to try medication first which is methimazole and will only transfer if we decide on other treatment methods.   Methimazole is titrated based on free T4 levels. Will have updated labs drawn then send the prescription to the pharmacy for the initial starting dose.

## 2021-08-25 ENCOUNTER — Telehealth: Payer: Self-pay

## 2021-08-25 NOTE — Telephone Encounter (Signed)
Left vm and sent mychart message to confirm 08/30/21 appointment-Toni

## 2021-08-29 ENCOUNTER — Telehealth: Payer: Self-pay

## 2021-08-29 LAB — HEPATIC FUNCTION PANEL
ALT: 18 IU/L (ref 0–32)
AST: 21 IU/L (ref 0–40)
Albumin: 3.9 g/dL (ref 3.9–4.9)
Alkaline Phosphatase: 107 IU/L (ref 44–121)
Bilirubin Total: 0.3 mg/dL (ref 0.0–1.2)
Bilirubin, Direct: 0.11 mg/dL (ref 0.00–0.40)
Total Protein: 6.5 g/dL (ref 6.0–8.5)

## 2021-08-29 LAB — CBC WITH DIFFERENTIAL/PLATELET
Basophils Absolute: 0 10*3/uL (ref 0.0–0.2)
Basos: 1 %
EOS (ABSOLUTE): 0.3 10*3/uL (ref 0.0–0.4)
Eos: 5 %
Hematocrit: 33.4 % — ABNORMAL LOW (ref 34.0–46.6)
Hemoglobin: 10.3 g/dL — ABNORMAL LOW (ref 11.1–15.9)
Immature Grans (Abs): 0 10*3/uL (ref 0.0–0.1)
Immature Granulocytes: 0 %
Lymphocytes Absolute: 2.3 10*3/uL (ref 0.7–3.1)
Lymphs: 41 %
MCH: 22 pg — ABNORMAL LOW (ref 26.6–33.0)
MCHC: 30.8 g/dL — ABNORMAL LOW (ref 31.5–35.7)
MCV: 71 fL — ABNORMAL LOW (ref 79–97)
Monocytes Absolute: 0.7 10*3/uL (ref 0.1–0.9)
Monocytes: 13 %
Neutrophils Absolute: 2.4 10*3/uL (ref 1.4–7.0)
Neutrophils: 40 %
Platelets: 239 10*3/uL (ref 150–450)
RBC: 4.68 x10E6/uL (ref 3.77–5.28)
RDW: 23.6 % — ABNORMAL HIGH (ref 11.7–15.4)
WBC: 5.7 10*3/uL (ref 3.4–10.8)

## 2021-08-29 LAB — TSH+FREE T4
Free T4: 2.58 ng/dL — ABNORMAL HIGH (ref 0.82–1.77)
TSH: <0.005 u[IU]/mL — ABNORMAL LOW (ref 0.450–4.500)

## 2021-08-29 NOTE — Progress Notes (Signed)
Will discuss labs at next office visit, Vivien Rota is going to call patient to have an office visit scheduled for this month since the patient rescheduled her annual physical for October.

## 2021-08-29 NOTE — Telephone Encounter (Signed)
Attempted to contact patient to schedule follow up appointment. No answer, vm full. Sent mychart message to have patient call office-Toni

## 2021-08-30 ENCOUNTER — Other Ambulatory Visit: Payer: Medicaid Other | Admitting: Nurse Practitioner

## 2021-09-01 ENCOUNTER — Telehealth: Payer: Self-pay

## 2021-09-01 ENCOUNTER — Telehealth: Payer: Medicaid Other | Admitting: Nurse Practitioner

## 2021-09-01 ENCOUNTER — Encounter: Payer: Self-pay | Admitting: Nurse Practitioner

## 2021-09-01 VITALS — Resp 16 | Ht 65.0 in

## 2021-09-01 DIAGNOSIS — Z91199 Patient's noncompliance with other medical treatment and regimen due to unspecified reason: Secondary | ICD-10-CM

## 2021-09-01 NOTE — Telephone Encounter (Signed)
Have tried to contact patient, no answer and VM is full.

## 2021-09-01 NOTE — Telephone Encounter (Signed)
-----   Message from Jonetta Osgood, NP sent at 08/29/2021  9:03 AM EDT ----- Will discuss labs at next office visit, Tammy Russo is going to call patient to have an office visit scheduled for this month since the patient rescheduled her annual physical for October.

## 2021-09-08 ENCOUNTER — Encounter: Payer: Self-pay | Admitting: Nurse Practitioner

## 2021-09-08 DIAGNOSIS — Z5181 Encounter for therapeutic drug level monitoring: Secondary | ICD-10-CM

## 2021-09-08 DIAGNOSIS — E0500 Thyrotoxicosis with diffuse goiter without thyrotoxic crisis or storm: Secondary | ICD-10-CM

## 2021-09-15 MED ORDER — METHIMAZOLE 5 MG PO TABS: 5.0000 mg | ORAL_TABLET | Freq: Every day | ORAL | 2 refills | Status: DC

## 2021-10-14 NOTE — Progress Notes (Signed)
No show for telehealth

## 2021-10-17 ENCOUNTER — Ambulatory Visit: Payer: Medicaid Other | Admitting: Nurse Practitioner

## 2021-10-23 ENCOUNTER — Other Ambulatory Visit: Payer: Medicaid Other | Admitting: Nurse Practitioner

## 2021-12-30 ENCOUNTER — Other Ambulatory Visit: Payer: Self-pay | Admitting: Nurse Practitioner

## 2021-12-30 DIAGNOSIS — E0500 Thyrotoxicosis with diffuse goiter without thyrotoxic crisis or storm: Secondary | ICD-10-CM

## 2022-01-01 NOTE — Telephone Encounter (Signed)
Pt had 2 no show and no next

## 2022-01-02 ENCOUNTER — Other Ambulatory Visit: Payer: Self-pay

## 2022-01-02 ENCOUNTER — Other Ambulatory Visit: Payer: Self-pay | Admitting: Nurse Practitioner

## 2022-01-02 DIAGNOSIS — E0500 Thyrotoxicosis with diffuse goiter without thyrotoxic crisis or storm: Secondary | ICD-10-CM

## 2022-01-02 MED ORDER — METHIMAZOLE 5 MG PO TABS
5.0000 mg | ORAL_TABLET | Freq: Every day | ORAL | 2 refills | Status: DC
  Filled 2022-01-02: qty 30, 30d supply, fill #0
  Filled 2022-01-25: qty 30, 30d supply, fill #1
  Filled 2022-02-22: qty 30, 30d supply, fill #2

## 2022-01-29 ENCOUNTER — Encounter: Payer: Self-pay | Admitting: Nurse Practitioner

## 2022-01-29 ENCOUNTER — Ambulatory Visit: Payer: Medicaid Other | Admitting: Nurse Practitioner

## 2022-01-29 ENCOUNTER — Other Ambulatory Visit: Payer: Self-pay

## 2022-01-29 VITALS — BP 116/84 | HR 91 | Temp 98.2°F | Resp 16 | Ht 65.0 in | Wt 134.6 lb

## 2022-01-29 DIAGNOSIS — D500 Iron deficiency anemia secondary to blood loss (chronic): Secondary | ICD-10-CM

## 2022-01-29 DIAGNOSIS — Z5181 Encounter for therapeutic drug level monitoring: Secondary | ICD-10-CM

## 2022-01-29 DIAGNOSIS — E0500 Thyrotoxicosis with diffuse goiter without thyrotoxic crisis or storm: Secondary | ICD-10-CM | POA: Diagnosis not present

## 2022-01-29 DIAGNOSIS — L918 Other hypertrophic disorders of the skin: Secondary | ICD-10-CM | POA: Diagnosis not present

## 2022-01-29 DIAGNOSIS — R000 Tachycardia, unspecified: Secondary | ICD-10-CM | POA: Diagnosis not present

## 2022-01-29 MED ORDER — PROPRANOLOL HCL 20 MG PO TABS
20.0000 mg | ORAL_TABLET | Freq: Three times a day (TID) | ORAL | 2 refills | Status: AC
  Filled 2022-01-29 – 2022-03-19 (×2): qty 90, 30d supply, fill #0
  Filled 2022-08-21: qty 90, 30d supply, fill #1

## 2022-01-29 NOTE — Progress Notes (Signed)
[facility] [place], [place] [address]  Internal MEDICINE  Office Visit Note  Patient Name: Tammy Russo  [id number]  [id number]  Date of Service: 01/29/2022  Chief Complaint  Patient presents with   Follow-up    HPI Tammy Russo presents for a follow up visit for grave's disease.  Just did labs this morning, results not available yet. Palpitations are improved on medications, neck feels smaller per patient  Dropped from 185 last year down to 134 lbs as of today, per patient gained 4 lbs since started drinking ensure.  Still taking iron, stopped eating ice     Current Medication: Outpatient Encounter Medications as of 01/29/2022  Medication Sig   albuterol (VENTOLIN HFA) 108 (90 Base) MCG/ACT inhaler Inhale 2 puffs into the lungs every 6 (six) hours as needed for wheezing or shortness of breath.   ferrous fumarate (HEMOCYTE - 106 MG FE) 325 (106 Fe) MG TABS tablet Take 1 tablet (106 mg of iron total) by mouth 2 (two) times daily.   levonorgestrel (MIRENA) 20 MCG/24HR IUD 1 each by Intrauterine route once.   methimazole (TAPAZOLE) 5 MG tablet Take 1 tablet (5 mg total) by mouth daily.   nystatin (MYCOSTATIN/NYSTOP) powder Apply 1 application topically 3 (three) times daily.   [DISCONTINUED] propranolol (INDERAL) 20 MG tablet Take 1 tablet (20 mg total) by mouth 3 (three) times daily.   propranolol (INDERAL) 20 MG tablet Take 1 tablet (20 mg total) by mouth 3 (three) times daily.   No facility-administered encounter medications on file as of 01/29/2022.    Surgical History: Past Surgical History:  Procedure Laterality Date   CERVICAL CONIZATION W/BX N/A 09/03/2019   Procedure: CONIZATION CERVIX WITH BIOPSY;  Surgeon: [name], MD;  Location: [facility] ORS;  Service: Gynecology;  Laterality: N/A;   INTRAUTERINE DEVICE (IUD) INSERTION  2014   Mirena    Medical History: Past Medical History:  Diagnosis Date   Anemia    HGSIL (high grade squamous  intraepithelial lesion) on Pap smear of cervix    conization w/bx was done previously    Family History: Family History  Adopted: Yes    Social History   Socioeconomic History   Marital status: Single    Spouse name: Not on file   Number of children: Not on file   Years of education: Not on file   Highest education level: Not on file  Occupational History   Not on file  Tobacco Use   Smoking status: Former    Packs/day: 0.50    Types: Cigarettes, E-cigarettes    Quit date: 05/26/2021    Years since quitting: 0.6   Smokeless tobacco: Never   Tobacco comments:    5 Cigarettes daily  Vaping Use   Vaping Use: Never used  Substance and Sexual Activity   Alcohol use: Yes    Comment: rare   Drug use: Yes    Types: Marijuana   Sexual activity: Yes    Birth control/protection: I.U.D.  Other Topics Concern   Not on file  Social History Narrative   Not on file   Social Determinants of Health   Financial Resource Strain: Not on file  Food Insecurity: Not on file  Transportation Needs: Not on file  Physical Activity: Not on file  Stress: Not on file  Social Connections: Not on file  Intimate Partner Violence: Not on file      Review of Systems  Constitutional:  Positive for activity change, appetite change, fatigue  and unexpected weight change.  HENT:  Negative for hearing loss, postnasal drip, sore throat, tinnitus and trouble swallowing.        Denies hair loss  Eyes: Negative.   Respiratory:  Negative for cough, chest tightness, shortness of breath and wheezing.   Cardiovascular:  Positive for palpitations (improved). Negative for chest pain and leg swelling.  Gastrointestinal:  Negative for abdominal pain, constipation, diarrhea, nausea and vomiting.  Endocrine: Negative.  Negative for cold intolerance, heat intolerance, polydipsia, polyphagia and polyuria.  Genitourinary: Negative.  Negative for frequency, hematuria, menstrual problem, pelvic pain, urgency,  vaginal bleeding, vaginal discharge and vaginal pain.  Musculoskeletal: Negative.  Negative for back pain, myalgias and neck stiffness.  Skin: Negative.  Negative for rash.  Allergic/Immunologic: Negative for immunocompromised state.  Neurological: Negative.  Negative for dizziness, syncope, weakness, light-headedness and headaches.  Hematological: Negative.  Negative for adenopathy. Does not bruise/bleed easily.  Psychiatric/Behavioral:  Positive for sleep disturbance. Negative for behavioral problems, confusion, self-injury and suicidal ideas. The patient is not nervous/anxious.     Vital Signs: BP 116/84   Pulse 91   Temp 98.2 F (36.8 C)   Resp 16   Ht '5\' 5"'$  (1.651 m)   Wt 134 lb 9.6 oz (61.1 kg)   SpO2 96%   BMI 22.40 kg/m    Physical Exam Vitals reviewed.  Constitutional:      General: She is not in acute distress.    Appearance: Normal appearance. She is well-developed and normal weight. She is not ill-appearing.  HENT:     Head: Normocephalic and atraumatic.  Eyes:     Pupils: Pupils are equal, round, and reactive to light.  Neck:     Thyroid: No thyromegaly.  Cardiovascular:     Rate and Rhythm: Normal rate and regular rhythm.     Heart sounds: Normal heart sounds. No murmur heard.    No gallop.  Pulmonary:     Effort: Pulmonary effort is normal. No respiratory distress.     Breath sounds: Normal breath sounds.  Abdominal:     General: Bowel sounds are normal.     Palpations: There is no hepatomegaly, splenomegaly or mass.     Tenderness: There is no abdominal tenderness. There is no guarding or rebound.  Skin:    Capillary Refill: Capillary refill takes less than 2 seconds.  Neurological:     Mental Status: She is alert and oriented to person, place, and time.     Cranial Nerves: No cranial nerve deficit.     Motor: No weakness.     Coordination: Coordination normal.     Gait: Gait normal.  Psychiatric:        Mood and Affect: Mood and affect normal.  Anxious: worried about symptoms, expected in this current situation.        Behavior: Behavior normal. Behavior is cooperative.        Thought Content: Thought content normal.        Judgment: Judgment normal.        Assessment/Plan: 1. Graves' disease Repeats labs ordered to be drawn in 6 weeks  - CBC with Differential/Platelet - CMP14+EGFR - Iron, TIBC and Ferritin Panel - TSH + free T4  2. Symptomatic tachycardia Continue propranolol as prescribed, refills ordered - propranolol (INDERAL) 20 MG tablet; Take 1 tablet (20 mg total) by mouth 3 (three) times daily.  Dispense: 90 tablet; Refill: 2  3. Iron deficiency anemia due to chronic blood loss Repeat labs ordered - CBC  with Differential/Platelet - Iron, TIBC and Ferritin Panel  4. Acquired skin tag Referred to dermatology for skin tag removal - Ambulatory referral to Dermatology  5. Therapeutic drug monitoring Lab draw due in 6 weeks  - CBC with Differential/Platelet - CMP14+EGFR - Iron, TIBC and Ferritin Panel - TSH + free T4   General Counseling: Natahsa verbalizes understanding of the findings of todays visit and agrees with plan of treatment. I have discussed any further diagnostic evaluation that may be needed or ordered today. We also reviewed her medications today. she has been encouraged to call the office with any questions or concerns that should arise related to todays visit.    Orders Placed This Encounter  Procedures   CBC with Differential/Platelet   CMP14+EGFR   Iron, TIBC and Ferritin Panel   TSH + free T4   Ambulatory referral to Dermatology    Meds ordered this encounter  Medications   propranolol (INDERAL) 20 MG tablet    Sig: Take 1 tablet (20 mg total) by mouth 3 (three) times daily.    Dispense:  90 tablet    Refill:  2    Return in about 7 weeks (around 03/19/2022) for F/U, Labs, Davette Nugent PCP and med adjustment. .   Total time spent:30 Minutes Time spent includes review of chart,  medications, test results, and follow up plan with the patient.   [place] Controlled Substance Database was reviewed by me.  This patient was seen by Jonetta Osgood, FNP-C in collaboration with Dr. Clayborn Bigness as a part of collaborative care agreement.   Shyra Emile R. Valetta Fuller, MSN, FNP-C Internal medicine

## 2022-01-30 LAB — TSH+FREE T4
Free T4: 1.49 ng/dL (ref 0.82–1.77)
TSH: <0.005 u[IU]/mL — ABNORMAL LOW (ref 0.450–4.500)

## 2022-01-31 ENCOUNTER — Telehealth: Payer: Self-pay | Admitting: Nurse Practitioner

## 2022-01-31 NOTE — Telephone Encounter (Signed)
Dermatology referral sent via [employer] to Raynelle Jan w/ [facility]

## 2022-02-05 NOTE — Progress Notes (Signed)
Continue current dose of methimazole.

## 2022-02-16 ENCOUNTER — Other Ambulatory Visit: Payer: Self-pay

## 2022-03-19 ENCOUNTER — Other Ambulatory Visit: Payer: Self-pay | Admitting: Nurse Practitioner

## 2022-03-19 ENCOUNTER — Ambulatory Visit: Payer: Medicaid Other | Admitting: Nurse Practitioner

## 2022-03-19 ENCOUNTER — Other Ambulatory Visit: Payer: Self-pay

## 2022-03-19 DIAGNOSIS — E0500 Thyrotoxicosis with diffuse goiter without thyrotoxic crisis or storm: Secondary | ICD-10-CM

## 2022-03-19 NOTE — Telephone Encounter (Signed)
Last 01/29/22 and next 03/28/22

## 2022-03-20 ENCOUNTER — Other Ambulatory Visit: Payer: Self-pay

## 2022-03-21 ENCOUNTER — Other Ambulatory Visit (HOSPITAL_COMMUNITY): Payer: Self-pay

## 2022-03-21 ENCOUNTER — Other Ambulatory Visit: Payer: Self-pay

## 2022-03-21 LAB — CBC WITH DIFFERENTIAL/PLATELET
Basophils Absolute: 0 10*3/uL (ref 0.0–0.2)
Basos: 0 %
EOS (ABSOLUTE): 0.4 10*3/uL (ref 0.0–0.4)
Eos: 5 %
Hematocrit: 35.6 % (ref 34.0–46.6)
Hemoglobin: 12 g/dL (ref 11.1–15.9)
Immature Grans (Abs): 0 10*3/uL (ref 0.0–0.1)
Immature Granulocytes: 0 %
Lymphocytes Absolute: 3.3 10*3/uL — ABNORMAL HIGH (ref 0.7–3.1)
Lymphs: 43 %
MCH: 27.1 pg (ref 26.6–33.0)
MCHC: 33.7 g/dL (ref 31.5–35.7)
MCV: 80 fL (ref 79–97)
Monocytes Absolute: 0.6 10*3/uL (ref 0.1–0.9)
Monocytes: 7 %
Neutrophils Absolute: 3.5 10*3/uL (ref 1.4–7.0)
Neutrophils: 45 %
Platelets: 230 10*3/uL (ref 150–450)
RBC: 4.43 x10E6/uL (ref 3.77–5.28)
RDW: 14.2 % (ref 11.7–15.4)
WBC: 7.8 10*3/uL (ref 3.4–10.8)

## 2022-03-21 LAB — IRON,TIBC AND FERRITIN PANEL
Ferritin: 32 ng/mL (ref 15–150)
Iron Saturation: 17 % (ref 15–55)
Iron: 48 ug/dL (ref 27–159)
Total Iron Binding Capacity: 286 ug/dL (ref 250–450)
UIBC: 238 ug/dL (ref 131–425)

## 2022-03-21 LAB — CMP14+EGFR
ALT: 15 IU/L (ref 0–32)
AST: 19 IU/L (ref 0–40)
Albumin/Globulin Ratio: 1.5 (ref 1.2–2.2)
Albumin: 4 g/dL (ref 3.9–4.9)
Alkaline Phosphatase: 101 IU/L (ref 44–121)
BUN/Creatinine Ratio: 20 (ref 9–23)
BUN: 10 mg/dL (ref 6–20)
Bilirubin Total: 0.5 mg/dL (ref 0.0–1.2)
CO2: 19 mmol/L — ABNORMAL LOW (ref 20–29)
Calcium: 8.9 mg/dL (ref 8.7–10.2)
Chloride: 107 mmol/L — ABNORMAL HIGH (ref 96–106)
Creatinine, Ser: 0.49 mg/dL — ABNORMAL LOW (ref 0.57–1.00)
Globulin, Total: 2.6 g/dL (ref 1.5–4.5)
Glucose: 112 mg/dL — ABNORMAL HIGH (ref 70–99)
Potassium: 4 mmol/L (ref 3.5–5.2)
Sodium: 141 mmol/L (ref 134–144)
Total Protein: 6.6 g/dL (ref 6.0–8.5)
eGFR: 124 mL/min/{1.73_m2} (ref >59)

## 2022-03-21 LAB — TSH+FREE T4
Free T4: 3.03 ng/dL — ABNORMAL HIGH (ref 0.82–1.77)
TSH: <0.005 u[IU]/mL — ABNORMAL LOW (ref 0.450–4.500)

## 2022-03-21 MED FILL — Methimazole Tab 5 MG: ORAL | 30 days supply | Qty: 30 | Fill #0 | Status: AC

## 2022-03-22 NOTE — Progress Notes (Signed)
We will discuss labs and treatment dose adjustments and next steps at upcoming appt on 03/28/22

## 2022-03-28 ENCOUNTER — Encounter: Payer: Self-pay | Admitting: Nurse Practitioner

## 2022-03-28 ENCOUNTER — Other Ambulatory Visit: Payer: Self-pay

## 2022-03-28 ENCOUNTER — Ambulatory Visit: Payer: Medicaid Other | Admitting: Nurse Practitioner

## 2022-03-28 VITALS — BP 120/70 | HR 88 | Temp 98.7°F | Resp 16 | Ht 65.0 in | Wt 137.8 lb

## 2022-03-28 DIAGNOSIS — E0500 Thyrotoxicosis with diffuse goiter without thyrotoxic crisis or storm: Secondary | ICD-10-CM | POA: Diagnosis not present

## 2022-03-28 MED ORDER — METHIMAZOLE 10 MG PO TABS
10.0000 mg | ORAL_TABLET | Freq: Every day | ORAL | 1 refills | Status: AC
  Filled 2022-03-28 – 2022-04-12 (×2): qty 90, 90d supply, fill #0
  Filled 2022-08-21: qty 90, 90d supply, fill #1

## 2022-03-28 NOTE — Progress Notes (Signed)
[facility] [place], [place] [address]  Internal MEDICINE  Office Visit Note  Patient Name: Tammy Russo  [id number]  [id number]  Date of Service: 03/28/2022  Chief Complaint  Patient presents with   Follow-up    HPI Anova presents for a follow-up visit for grave's disease.  Patient has Grave's disease. She has been on 5 mg methimazole daily for the past several months.  Graves disease . Her free T4 level has increased to 3.03. she will need a dose adjustment.  Patient did send a message regarding thyroid surgery 1-2 months ago. This was discussed with the patient and she reports that she has changed her mind. She explains that she was having a bad day and was upset regarding her diagnosis. She is no longer considering thyroid surgery because she is aware that she would have to take levothyroxine the rest of her life.     Current Medication: Outpatient Encounter Medications as of 03/28/2022  Medication Sig   albuterol (VENTOLIN HFA) 108 (90 Base) MCG/ACT inhaler Inhale 2 puffs into the lungs every 6 (six) hours as needed for wheezing or shortness of breath.   ferrous fumarate (HEMOCYTE - 106 MG FE) 325 (106 Fe) MG TABS tablet Take 1 tablet (106 mg of iron total) by mouth 2 (two) times daily.   levonorgestrel (MIRENA) 20 MCG/24HR IUD 1 each by Intrauterine route once.   methimazole (TAPAZOLE) 10 MG tablet Take 1 tablet (10 mg total) by mouth daily.   nystatin (MYCOSTATIN/NYSTOP) powder Apply 1 application topically 3 (three) times daily.   propranolol (INDERAL) 20 MG tablet Take 1 tablet (20 mg total) by mouth 3 (three) times daily.   [DISCONTINUED] methimazole (TAPAZOLE) 5 MG tablet Take 1 tablet (5 mg total) by mouth daily.   No facility-administered encounter medications on file as of 03/28/2022.    Surgical History: Past Surgical History:  Procedure Laterality Date   CERVICAL CONIZATION W/BX N/A 09/03/2019   Procedure: CONIZATION CERVIX WITH  BIOPSY;  Surgeon: [name], MD;  Location: [facility] ORS;  Service: Gynecology;  Laterality: N/A;   INTRAUTERINE DEVICE (IUD) INSERTION  2014   Mirena    Medical History: Past Medical History:  Diagnosis Date   Anemia    HGSIL (high grade squamous intraepithelial lesion) on Pap smear of cervix    conization w/bx was done previously    Family History: Family History  Adopted: Yes    Social History   Socioeconomic History   Marital status: Single    Spouse name: Not on file   Number of children: Not on file   Years of education: Not on file   Highest education level: Not on file  Occupational History   Not on file  Tobacco Use   Smoking status: Former    Packs/day: 0.50    Types: Cigarettes, E-cigarettes    Quit date: 05/26/2021    Years since quitting: 0.8   Smokeless tobacco: Never   Tobacco comments:    5 Cigarettes daily  Vaping Use   Vaping Use: Never used  Substance and Sexual Activity   Alcohol use: Yes    Comment: rare   Drug use: Yes    Types: Marijuana   Sexual activity: Yes    Birth control/protection: I.U.D.  Other Topics Concern   Not on file  Social History Narrative   Not on file   Social Determinants of Health   Financial Resource Strain: Not on file  Food Insecurity: Not on file  Transportation Needs: Not on file  Physical Activity: Not on file  Stress: Not on file  Social Connections: Not on file  Intimate Partner Violence: Not on file      Review of Systems  Constitutional:  Positive for activity change, appetite change (has been adding protein shakes to her diet to try to gain some weight.), fatigue and unexpected weight change (gained 3 lbs since last office visit).  HENT:  Negative for hearing loss, postnasal drip, sore throat, tinnitus and trouble swallowing.        Denies hair loss  Eyes: Negative.   Respiratory:  Negative for cough, chest tightness, shortness of breath and wheezing.   Cardiovascular:  Positive for  palpitations (slightly worse recently). Negative for chest pain and leg swelling.  Gastrointestinal:  Negative for abdominal pain, constipation, diarrhea, nausea and vomiting.  Endocrine: Negative.  Negative for cold intolerance, heat intolerance, polydipsia, polyphagia and polyuria.  Genitourinary: Negative.  Negative for frequency, hematuria, menstrual problem, pelvic pain, urgency, vaginal bleeding, vaginal discharge and vaginal pain.  Musculoskeletal: Negative.  Negative for back pain, myalgias and neck stiffness.  Skin: Negative.  Negative for rash.  Allergic/Immunologic: Negative for immunocompromised state.  Neurological: Negative.  Negative for dizziness, syncope, weakness, light-headedness and headaches.  Hematological: Negative.  Negative for adenopathy. Does not bruise/bleed easily.  Psychiatric/Behavioral:  Positive for sleep disturbance. Negative for behavioral problems, confusion, self-injury and suicidal ideas. The patient is not nervous/anxious.     Vital Signs: BP 120/70   Pulse 88   Temp 98.7 F (37.1 C)   Resp 16   Ht 5\' 5"  (1.651 m)   Wt 137 lb 12.8 oz (62.5 kg)   SpO2 99%   BMI 22.93 kg/m    Physical Exam Vitals reviewed.  Constitutional:      General: She is not in acute distress.    Appearance: Normal appearance. She is normal weight. She is ill-appearing (appears fatigued/tired).  HENT:     Head: Normocephalic and atraumatic.  Eyes:     Pupils: Pupils are equal, round, and reactive to light.  Cardiovascular:     Rate and Rhythm: Normal rate and regular rhythm.     Comments: Rate is controlled with propranolol.  Pulmonary:     Effort: Pulmonary effort is normal.  Neurological:     Mental Status: She is alert and oriented to person, place, and time.  Psychiatric:        Mood and Affect: Mood normal.        Behavior: Behavior normal.        Assessment/Plan: 1. Graves' disease Methimazole dose increased to 10 mg daily.  TSH & free T4 level  ordered for patient to repeat in 6 weeks from now Continue propranolol as prescribed to control palpitations and tachycardia  Continue iron supplement for chronic anemia.  - methimazole (TAPAZOLE) 10 MG tablet; Take 1 tablet (10 mg total) by mouth daily.  Dispense: 90 tablet; Refill: 1 - TSH + free T4   General Counseling: Nayellie verbalizes understanding of the findings of todays visit and agrees with plan of treatment. I have discussed any further diagnostic evaluation that may be needed or ordered today. We also reviewed her medications today. she has been encouraged to call the office with any questions or concerns that should arise related to todays visit.    Orders Placed This Encounter  Procedures   TSH + free T4    Meds ordered this encounter  Medications   methimazole (TAPAZOLE) 10 MG  tablet    Sig: Take 1 tablet (10 mg total) by mouth daily.    Dispense:  90 tablet    Refill:  1    Return for virtual in 2 months.   Total time spent:20 Minutes Time spent includes review of chart, medications, test results, and follow up plan with the patient.   [place] Controlled Substance Database was reviewed by me.  This patient was seen by Jonetta Osgood, FNP-C in collaboration with Dr. Clayborn Bigness as a part of collaborative care agreement.   Jonpaul Lumm R. Valetta Fuller, MSN, FNP-C Internal medicine

## 2022-04-06 ENCOUNTER — Other Ambulatory Visit: Payer: Self-pay

## 2022-04-12 ENCOUNTER — Other Ambulatory Visit: Payer: Self-pay

## 2022-04-14 ENCOUNTER — Encounter: Payer: Self-pay | Admitting: Nurse Practitioner

## 2022-05-30 ENCOUNTER — Telehealth: Payer: Medicaid Other | Admitting: Nurse Practitioner

## 2022-06-27 ENCOUNTER — Telehealth: Payer: Self-pay | Admitting: Nurse Practitioner

## 2022-06-27 DIAGNOSIS — E0500 Thyrotoxicosis with diffuse goiter without thyrotoxic crisis or storm: Secondary | ICD-10-CM

## 2022-06-28 NOTE — Telephone Encounter (Signed)
error 

## 2022-07-04 ENCOUNTER — Ambulatory Visit (INDEPENDENT_AMBULATORY_CARE_PROVIDER_SITE_OTHER): Payer: Medicaid Other | Admitting: Nurse Practitioner

## 2022-07-04 ENCOUNTER — Encounter: Payer: Self-pay | Admitting: Nurse Practitioner

## 2022-07-04 VITALS — BP 122/86 | HR 100 | Temp 98.7°F | Resp 16 | Ht 65.0 in | Wt 149.4 lb

## 2022-07-04 DIAGNOSIS — D500 Iron deficiency anemia secondary to blood loss (chronic): Secondary | ICD-10-CM

## 2022-07-04 DIAGNOSIS — R000 Tachycardia, unspecified: Secondary | ICD-10-CM | POA: Diagnosis not present

## 2022-07-04 DIAGNOSIS — E0500 Thyrotoxicosis with diffuse goiter without thyrotoxic crisis or storm: Secondary | ICD-10-CM

## 2022-07-04 NOTE — Progress Notes (Signed)
[facility] [address] [place], [place] [address]  Internal MEDICINE  Office Visit Note  Patient Name: Tammy Russo  [id number]  [id number]  Date of Service: 07/04/2022  Chief Complaint  Patient presents with   Follow-up    HPI Tammy Russo presents for a follow-up visit for grave's disease Taking methimazole 10 mg daily. Had her thyroid labs drawn yesterday, results not available yet.  Blood pressure and heart rate are ok  Gained 12 lbs since her last office visit. She has been drinking 3 ensures daily, she recently decreased this to 2 ensures per day.    Current Medication: Outpatient Encounter Medications as of 07/04/2022  Medication Sig   albuterol (VENTOLIN HFA) 108 (90 Base) MCG/ACT inhaler Inhale 2 puffs into the lungs every 6 (six) hours as needed for wheezing or shortness of breath.   ferrous fumarate (HEMOCYTE - 106 MG FE) 325 (106 Fe) MG TABS tablet Take 1 tablet (106 mg of iron total) by mouth 2 (two) times daily.   levonorgestrel (MIRENA) 20 MCG/24HR IUD 1 each by Intrauterine route once.   methimazole (TAPAZOLE) 10 MG tablet Take 1 tablet (10 mg total) by mouth daily.   nystatin (MYCOSTATIN/NYSTOP) powder Apply 1 application topically 3 (three) times daily.   propranolol (INDERAL) 20 MG tablet Take 1 tablet (20 mg total) by mouth 3 (three) times daily.   No facility-administered encounter medications on file as of 07/04/2022.    Surgical History: Past Surgical History:  Procedure Laterality Date   CERVICAL CONIZATION W/BX N/A 09/03/2019   Procedure: CONIZATION CERVIX WITH BIOPSY;  Surgeon: Vena Austria, MD;  Location: [facility] ORS;  Service: Gynecology;  Laterality: N/A;   INTRAUTERINE DEVICE (IUD) INSERTION  2014   Mirena    Medical History: Past Medical History:  Diagnosis Date   Anemia    HGSIL (high grade squamous intraepithelial lesion) on Pap smear of cervix    conization w/bx was done previously    Family History: Family History   Adopted: Yes    Social History   Socioeconomic History   Marital status: Single    Spouse name: Not on file   Number of children: Not on file   Years of education: Not on file   Highest education level: Not on file  Occupational History   Not on file  Tobacco Use   Smoking status: Former    Packs/day: .5    Types: Cigarettes, E-cigarettes    Quit date: 05/26/2021    Years since quitting: 1.1   Smokeless tobacco: Never   Tobacco comments:    5 Cigarettes daily  Vaping Use   Vaping Use: Never used  Substance and Sexual Activity   Alcohol use: Yes    Comment: rare   Drug use: Yes    Types: Marijuana   Sexual activity: Yes    Birth control/protection: I.U.D.  Other Topics Concern   Not on file  Social History Narrative   Not on file   Social Determinants of Health   Financial Resource Strain: Not on file  Food Insecurity: Not on file  Transportation Needs: Not on file  Physical Activity: Not on file  Stress: Not on file  Social Connections: Not on file  Intimate Partner Violence: Not on file      Review of Systems  Constitutional:  Positive for activity change, appetite change (has been adding protein shakes to her diet to try to gain some weight.), fatigue and unexpected weight change (gained 3 lbs since last  office visit).  HENT:  Negative for hearing loss, postnasal drip, sore throat, tinnitus and trouble swallowing.        Denies hair loss  Eyes: Negative.   Respiratory:  Negative for cough, chest tightness, shortness of breath and wheezing.   Cardiovascular:  Positive for palpitations (slightly worse recently). Negative for chest pain and leg swelling.  Gastrointestinal:  Negative for abdominal pain, constipation, diarrhea, nausea and vomiting.  Endocrine: Negative.  Negative for cold intolerance, heat intolerance, polydipsia, polyphagia and polyuria.  Genitourinary: Negative.  Negative for frequency, hematuria, menstrual problem, pelvic pain, urgency,  vaginal bleeding, vaginal discharge and vaginal pain.  Musculoskeletal: Negative.  Negative for back pain, myalgias and neck stiffness.  Skin: Negative.  Negative for rash.  Allergic/Immunologic: Negative for immunocompromised state.  Neurological: Negative.  Negative for dizziness, syncope, weakness, light-headedness and headaches.  Hematological: Negative.  Negative for adenopathy. Does not bruise/bleed easily.  Psychiatric/Behavioral:  Positive for sleep disturbance. Negative for behavioral problems, confusion, self-injury and suicidal ideas. The patient is not nervous/anxious.     Vital Signs: BP 122/86   Pulse 100   Temp 98.7 F (37.1 C)   Resp 16   Ht 5\' 5"  (1.651 m)   Wt 149 lb 6.4 oz (67.8 kg)   SpO2 98%   BMI 24.86 kg/m    Physical Exam Vitals reviewed.  Constitutional:      General: She is not in acute distress.    Appearance: Normal appearance. She is normal weight. She is not ill-appearing.  HENT:     Head: Normocephalic and atraumatic.  Eyes:     Pupils: Pupils are equal, round, and reactive to light.  Cardiovascular:     Rate and Rhythm: Normal rate and regular rhythm.     Comments: Rate is controlled with propranolol.  Pulmonary:     Effort: Pulmonary effort is normal.  Neurological:     Mental Status: She is alert and oriented to person, place, and time.  Psychiatric:        Mood and Affect: Mood normal.        Behavior: Behavior normal.        Assessment/Plan: 1. Graves' disease Pending thyroid labs, will review and adjust methimazole dose accordingly  2. Symptomatic tachycardia Controlled with propranolol  3. Iron deficiency anemia due to chronic blood loss Stable as of her most recent CBC   General Counseling: Tammy Russo verbalizes understanding of the findings of todays visit and agrees with plan of treatment. I have discussed any further diagnostic evaluation that may be needed or ordered today. We also reviewed her medications today. she  has been encouraged to call the office with any questions or concerns that should arise related to todays visit.    No orders of the defined types were placed in this encounter.   No orders of the defined types were placed in this encounter.   Return in about 3 months (around 10/04/2022) for F/U, Tammy Russo PCP grave's disease .   Total time spent:30 Minutes Time spent includes review of chart, medications, test results, and follow up plan with the patient.   [place] Controlled Substance Database was reviewed by me.  This patient was seen by Sallyanne Kuster, FNP-C in collaboration with Dr. Beverely Risen as a part of collaborative care agreement.   Joyice Magda R. Tedd Sias, MSN, FNP-C Internal medicine

## 2022-07-05 LAB — TSH+FREE T4
Free T4: 1.56 ng/dL (ref 0.82–1.77)
TSH: <0.005 u[IU]/mL — ABNORMAL LOW (ref 0.450–4.500)

## 2022-07-13 ENCOUNTER — Telehealth: Payer: Self-pay

## 2022-07-13 NOTE — Progress Notes (Signed)
Continue methimazole 10 mg daily. No change in dose, we will repeat the level in 3 months

## 2022-07-13 NOTE — Telephone Encounter (Signed)
-----   Message from Sallyanne Kuster, NP sent at 07/13/2022  1:17 PM EDT ----- Continue methimazole 10 mg daily. No change in dose, we will repeat the level in 3 months

## 2022-07-13 NOTE — Telephone Encounter (Signed)
Spoke with patient regarding thyroid levels.

## 2022-08-21 IMAGING — CR DG CHEST 2V
1 series · 2 of 2 positions shown · non-contrast
Comparison: 02/15/2020

CLINICAL DATA: Shortness of breath

EXAM:
CHEST - 2 VIEW

[Series 1: dg chest 2 view · 0.14mm/px · 2 of 2 slices shown]
[im 1/2]
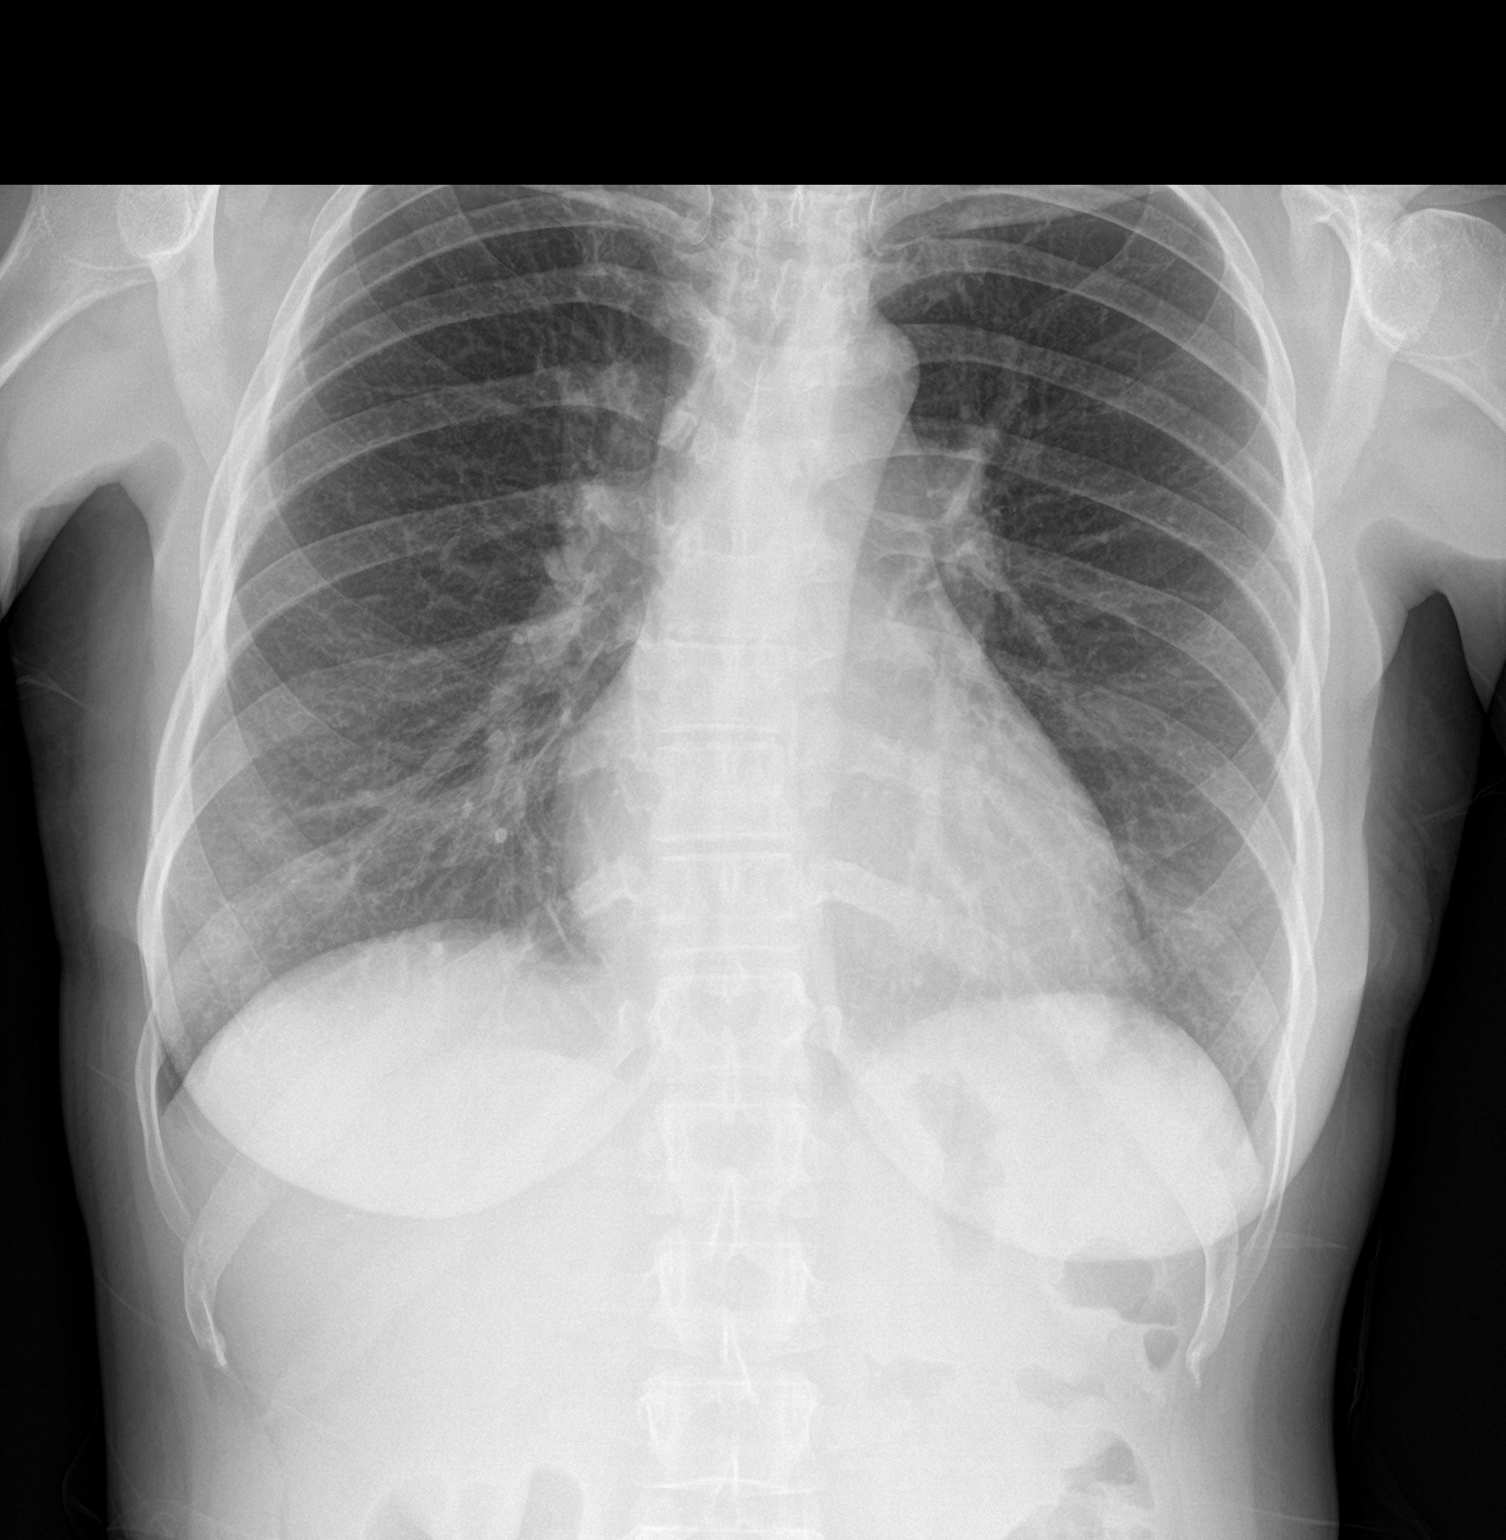
[im 2/2]
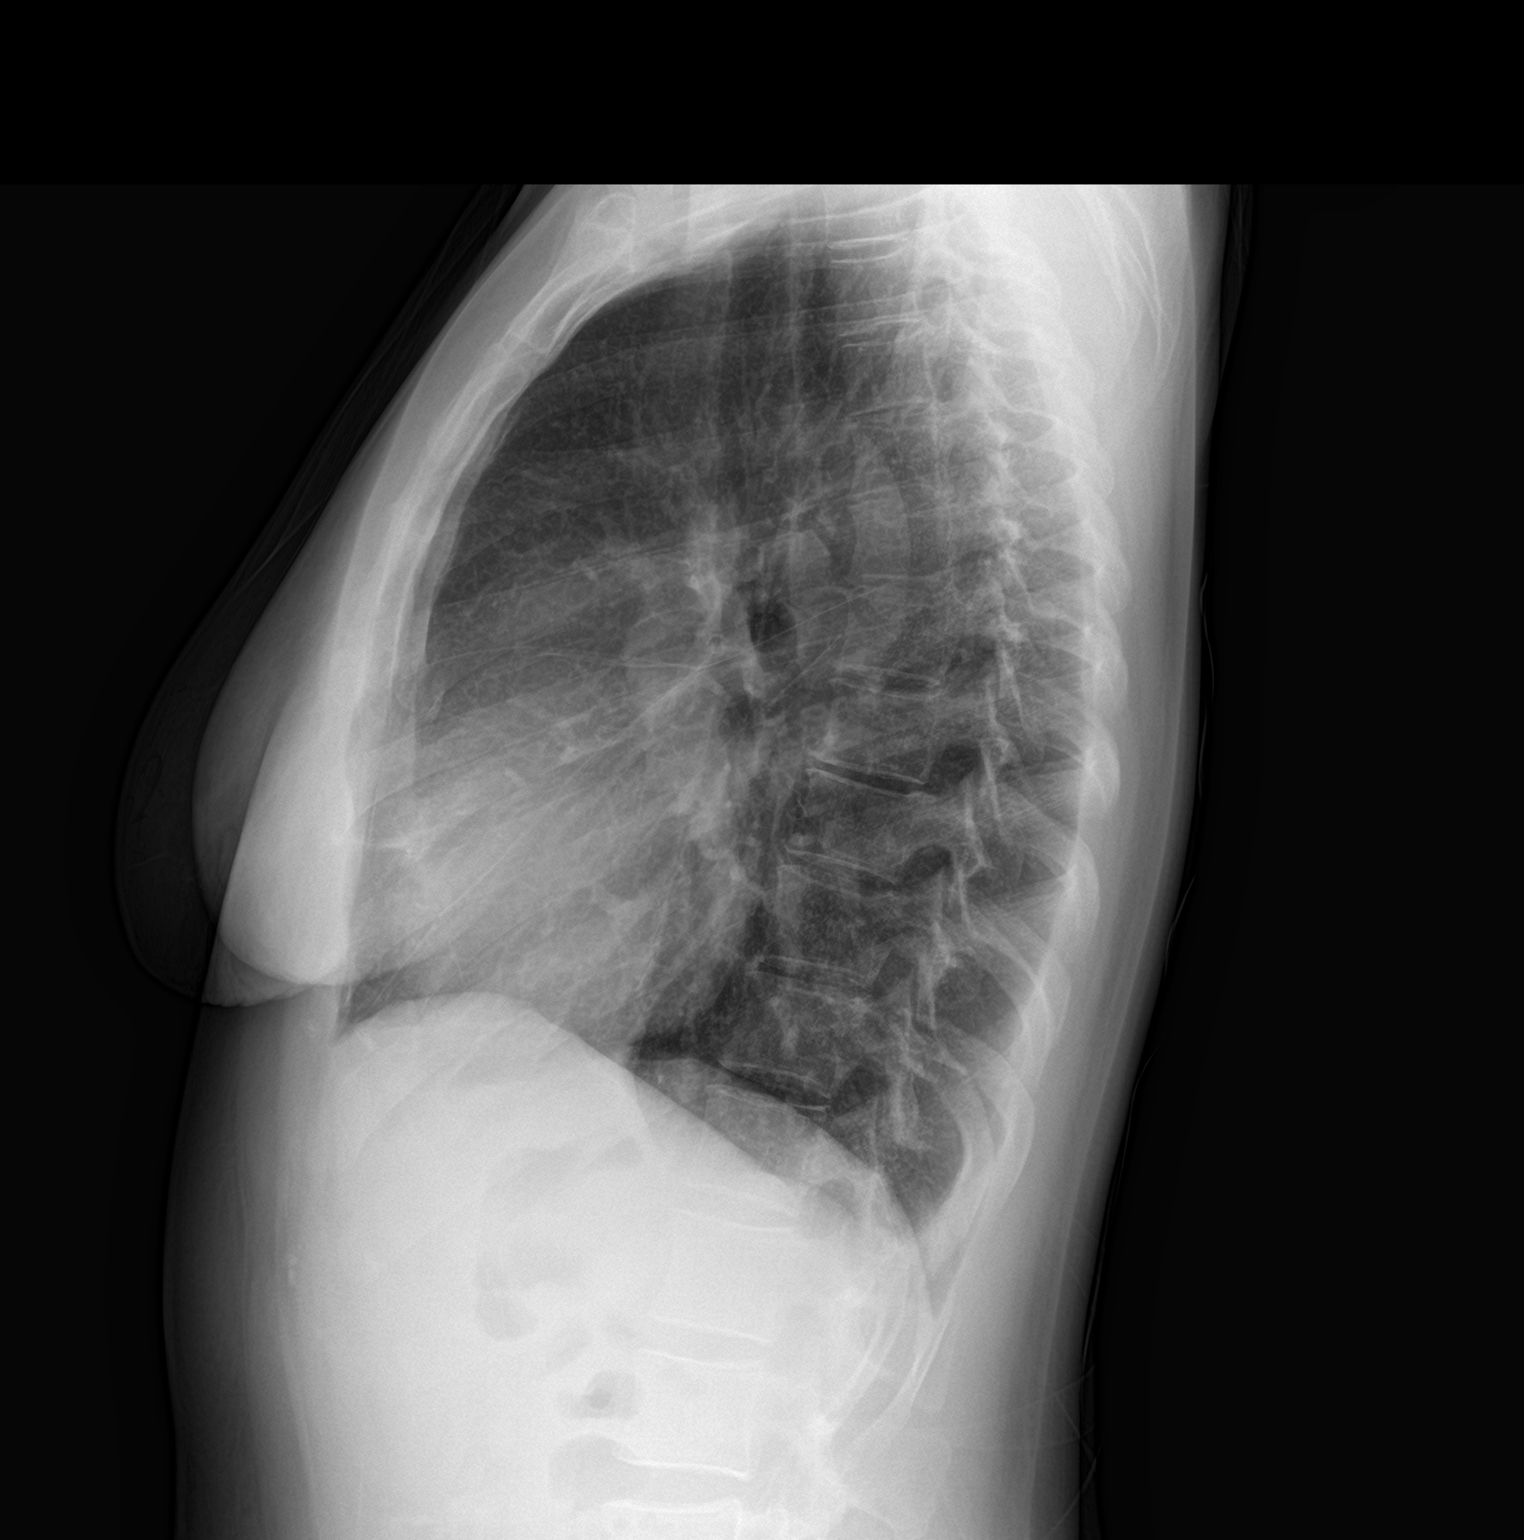

[2 of 2 positions shown; findings below may reference images not displayed]

FINDINGS: The heart size and mediastinal contours are within normal limits.
Both lungs are clear. The visualized skeletal structures are
unremarkable.
IMPRESSION: No active cardiopulmonary disease.

## 2022-10-03 ENCOUNTER — Ambulatory Visit: Payer: Medicaid Other | Admitting: Nurse Practitioner

## 2022-10-05 ENCOUNTER — Ambulatory Visit: Payer: Medicaid Other | Admitting: Nurse Practitioner

## 2022-10-18 ENCOUNTER — Telehealth: Payer: Self-pay | Admitting: Nurse Practitioner

## 2022-10-18 NOTE — Telephone Encounter (Signed)
Lvm to reschedule missed appt on 10/05/22
